# Patient Record
Sex: Female | Born: 1970 | Race: Black or African American | Hispanic: No | Marital: Single | Smoking: Never smoker
Health system: Southern US, Community
[De-identification: ages and names within clinical notes are randomized; demographics above are authoritative.]

## PROBLEM LIST (undated history)

## (undated) DIAGNOSIS — Z21 Asymptomatic human immunodeficiency virus [HIV] infection status: Secondary | ICD-10-CM

## (undated) DIAGNOSIS — N390 Urinary tract infection, site not specified: Secondary | ICD-10-CM

## (undated) DIAGNOSIS — B20 Human immunodeficiency virus [HIV] disease: Secondary | ICD-10-CM

## (undated) DIAGNOSIS — A64 Unspecified sexually transmitted disease: Secondary | ICD-10-CM

## (undated) DIAGNOSIS — L309 Dermatitis, unspecified: Secondary | ICD-10-CM

## (undated) DIAGNOSIS — J45909 Unspecified asthma, uncomplicated: Secondary | ICD-10-CM

## (undated) DIAGNOSIS — IMO0002 Reserved for concepts with insufficient information to code with codable children: Secondary | ICD-10-CM

## (undated) HISTORY — DX: Unspecified asthma, uncomplicated: J45.909

## (undated) HISTORY — PX: THERAPEUTIC ABORTION: SHX798

---

## 1996-08-10 DIAGNOSIS — IMO0002 Reserved for concepts with insufficient information to code with codable children: Secondary | ICD-10-CM

## 1996-08-10 DIAGNOSIS — R87619 Unspecified abnormal cytological findings in specimens from cervix uteri: Secondary | ICD-10-CM

## 1996-08-10 HISTORY — DX: Reserved for concepts with insufficient information to code with codable children: IMO0002

## 1996-08-10 HISTORY — DX: Unspecified abnormal cytological findings in specimens from cervix uteri: R87.619

## 1997-11-16 ENCOUNTER — Other Ambulatory Visit: Admission: RE | Admit: 1997-11-16 | Discharge: 1997-11-16 | Payer: Self-pay | Admitting: Obstetrics & Gynecology

## 1998-03-12 ENCOUNTER — Other Ambulatory Visit: Admission: RE | Admit: 1998-03-12 | Discharge: 1998-03-12 | Payer: Self-pay | Admitting: Obstetrics and Gynecology

## 1998-04-03 ENCOUNTER — Encounter: Admission: RE | Admit: 1998-04-03 | Discharge: 1998-04-03 | Payer: Self-pay | Admitting: Internal Medicine

## 1998-06-13 ENCOUNTER — Ambulatory Visit (HOSPITAL_COMMUNITY): Admission: RE | Admit: 1998-06-13 | Discharge: 1998-06-13 | Payer: Self-pay | Admitting: Infectious Diseases

## 1998-06-13 ENCOUNTER — Encounter: Admission: RE | Admit: 1998-06-13 | Discharge: 1998-06-13 | Payer: Self-pay | Admitting: Infectious Diseases

## 1998-07-19 ENCOUNTER — Encounter: Admission: RE | Admit: 1998-07-19 | Discharge: 1998-07-19 | Payer: Self-pay | Admitting: Internal Medicine

## 2001-08-30 ENCOUNTER — Inpatient Hospital Stay (HOSPITAL_COMMUNITY): Admission: AD | Admit: 2001-08-30 | Discharge: 2001-08-30 | Payer: Self-pay | Admitting: *Deleted

## 2002-08-15 ENCOUNTER — Inpatient Hospital Stay (HOSPITAL_COMMUNITY): Admission: AD | Admit: 2002-08-15 | Discharge: 2002-08-15 | Payer: Self-pay | Admitting: *Deleted

## 2007-12-26 ENCOUNTER — Emergency Department (HOSPITAL_COMMUNITY): Admission: EM | Admit: 2007-12-26 | Discharge: 2007-12-26 | Payer: Self-pay | Admitting: Emergency Medicine

## 2007-12-29 ENCOUNTER — Emergency Department (HOSPITAL_COMMUNITY): Admission: EM | Admit: 2007-12-29 | Discharge: 2007-12-29 | Payer: Self-pay | Admitting: Emergency Medicine

## 2009-08-31 ENCOUNTER — Emergency Department (HOSPITAL_COMMUNITY): Admission: EM | Admit: 2009-08-31 | Discharge: 2009-08-31 | Payer: Self-pay | Admitting: Emergency Medicine

## 2009-09-23 ENCOUNTER — Emergency Department (HOSPITAL_COMMUNITY): Admission: EM | Admit: 2009-09-23 | Discharge: 2009-09-23 | Payer: Self-pay | Admitting: Emergency Medicine

## 2010-07-22 ENCOUNTER — Inpatient Hospital Stay (HOSPITAL_COMMUNITY)
Admission: AD | Admit: 2010-07-22 | Discharge: 2010-07-22 | Payer: Self-pay | Source: Home / Self Care | Attending: Obstetrics & Gynecology | Admitting: Obstetrics & Gynecology

## 2010-10-20 LAB — URINALYSIS, ROUTINE W REFLEX MICROSCOPIC
Bilirubin Urine: NEGATIVE
Hgb urine dipstick: NEGATIVE
Nitrite: NEGATIVE
Protein, ur: NEGATIVE mg/dL
Urobilinogen, UA: 0.2 mg/dL (ref 0.0–1.0)

## 2010-10-20 LAB — WET PREP, GENITAL: Trich, Wet Prep: NONE SEEN

## 2010-10-20 LAB — GC/CHLAMYDIA PROBE AMP, GENITAL
Chlamydia, DNA Probe: NEGATIVE
GC Probe Amp, Genital: NEGATIVE

## 2011-02-10 ENCOUNTER — Emergency Department (HOSPITAL_COMMUNITY)
Admission: EM | Admit: 2011-02-10 | Discharge: 2011-02-10 | Disposition: A | Discharge status: Home / Self Care | Payer: Self-pay | Attending: Emergency Medicine | Admitting: Emergency Medicine

## 2011-02-10 DIAGNOSIS — H9209 Otalgia, unspecified ear: Secondary | ICD-10-CM | POA: Insufficient documentation

## 2011-02-10 DIAGNOSIS — K029 Dental caries, unspecified: Secondary | ICD-10-CM | POA: Insufficient documentation

## 2011-02-10 DIAGNOSIS — J029 Acute pharyngitis, unspecified: Secondary | ICD-10-CM | POA: Insufficient documentation

## 2011-02-10 DIAGNOSIS — R059 Cough, unspecified: Secondary | ICD-10-CM | POA: Insufficient documentation

## 2011-02-10 DIAGNOSIS — J069 Acute upper respiratory infection, unspecified: Secondary | ICD-10-CM | POA: Insufficient documentation

## 2011-02-10 DIAGNOSIS — R51 Headache: Secondary | ICD-10-CM | POA: Insufficient documentation

## 2011-02-10 DIAGNOSIS — R05 Cough: Secondary | ICD-10-CM | POA: Insufficient documentation

## 2011-07-15 ENCOUNTER — Emergency Department (HOSPITAL_COMMUNITY): Payer: Self-pay

## 2011-07-15 ENCOUNTER — Encounter: Payer: Self-pay | Admitting: Emergency Medicine

## 2011-07-15 ENCOUNTER — Emergency Department (HOSPITAL_COMMUNITY)
Admission: EM | Admit: 2011-07-15 | Discharge: 2011-07-15 | Disposition: A | Discharge status: Home / Self Care | Payer: Self-pay | Attending: Emergency Medicine | Admitting: Emergency Medicine

## 2011-07-15 DIAGNOSIS — R062 Wheezing: Secondary | ICD-10-CM | POA: Insufficient documentation

## 2011-07-15 DIAGNOSIS — J40 Bronchitis, not specified as acute or chronic: Secondary | ICD-10-CM | POA: Insufficient documentation

## 2011-07-15 DIAGNOSIS — R000 Tachycardia, unspecified: Secondary | ICD-10-CM | POA: Insufficient documentation

## 2011-07-15 DIAGNOSIS — R059 Cough, unspecified: Secondary | ICD-10-CM | POA: Insufficient documentation

## 2011-07-15 DIAGNOSIS — R509 Fever, unspecified: Secondary | ICD-10-CM | POA: Insufficient documentation

## 2011-07-15 DIAGNOSIS — R05 Cough: Secondary | ICD-10-CM | POA: Insufficient documentation

## 2011-07-15 DIAGNOSIS — R0602 Shortness of breath: Secondary | ICD-10-CM | POA: Insufficient documentation

## 2011-07-15 DIAGNOSIS — R112 Nausea with vomiting, unspecified: Secondary | ICD-10-CM | POA: Insufficient documentation

## 2011-07-15 MED ORDER — ALBUTEROL SULFATE HFA 108 (90 BASE) MCG/ACT IN AERS
2.0000 | INHALATION_SPRAY | RESPIRATORY_TRACT | Status: DC | PRN
  Administered 2011-07-15: 2 via RESPIRATORY_TRACT
  Filled 2011-07-15: qty 6.7

## 2011-07-15 MED ORDER — ACETAMINOPHEN 325 MG PO TABS
650.0000 mg | ORAL_TABLET | Freq: Once | ORAL | Status: AC
  Administered 2011-07-15: 650 mg via ORAL
  Filled 2011-07-15: qty 2

## 2011-07-15 NOTE — ED Notes (Signed)
Pt alert, nad, c/o cough, sob, onset several days ago, moist non productive cough noted, resp even unlabored, pt believes she may be pregnant

## 2011-07-15 NOTE — ED Provider Notes (Signed)
History     CSN: [id number] Arrival date & time: 07/15/2011  7:44 PM   First MD Initiated Contact with Patient 07/15/11 2109      Chief Complaint  Patient presents with  . Cough  . Shortness of Breath    (Consider location/radiation/quality/duration/timing/severity/associated sxs/prior treatment) HPI Comments: Kristin Mason reports 3 day history of coughing, wheezing, shortness of breath with exertion, low-grade fever to 102.  Denies myalgias, headache, rhinitis, abdominal pain.  She does, state she's had several episodes of nausea and vomiting without diarrhea, requesting a pregnancy test.  She has taken Tylenol or Advil for her fever.  No over-the-counter cough preparations, she has not gone to work for the past 2 days and is requesting a work.  Patient is a 40 y.o. female presenting with cough and shortness of breath. The history is provided by the patient.  Cough This is a new problem. The current episode started more than 2 days ago. The problem occurs constantly. The problem has not changed since onset.The cough is non-productive. The maximum temperature recorded prior to her arrival was 101 to 101.9 F. The fever has been present for 1 to 2 days. Associated symptoms include myalgias, shortness of breath and wheezing. Pertinent negatives include no chest pain, no chills, no sweats, no weight loss, no headaches, no rhinorrhea, no sore throat and no eye redness. She has tried nothing for the symptoms. She is not a smoker.  Shortness of Breath  Associated symptoms include a fever, cough, shortness of breath and wheezing. Pertinent negatives include no chest pain, no rhinorrhea and no sore throat.    History reviewed. No pertinent past medical history.  Past Surgical History  Procedure Date  . Cesarean section     No family history on file.  History  Substance Use Topics  . Smoking status: Not on file  . Smokeless tobacco: Not on file  . Alcohol Use:     OB History    Grav  Para Term Preterm Abortions TAB SAB Ect Mult Living                  Review of Systems  Constitutional: Positive for fever. Negative for chills, weight loss and activity change.  HENT: Negative for sore throat and rhinorrhea.   Eyes: Negative.  Negative for redness.  Respiratory: Positive for cough, shortness of breath and wheezing.   Cardiovascular: Negative for chest pain.  Gastrointestinal: Positive for nausea and vomiting. Negative for abdominal distention.  Genitourinary: Negative for dysuria, frequency, vaginal bleeding and vaginal discharge.  Musculoskeletal: Positive for myalgias. Negative for back pain.  Neurological: Negative for dizziness, weakness and headaches.  Hematological: Negative.     Allergies  Penicillins  Home Medications   Current Outpatient Rx  Name Route Sig Dispense Refill  . CLINDAMYCIN HCL 150 MG PO CAPS Oral Take 150 mg by mouth 3 (three) times daily. 10 day course of therapy; completed.     . GUAIFENESIN-DM 100-10 MG/5ML PO SYRP Oral Take 10 mLs by mouth every 4 (four) hours as needed. For cough.     . IBUPROFEN 200 MG PO TABS Oral Take 600 mg by mouth every 6 (six) hours as needed. For pain.     . OXYCODONE-ACETAMINOPHEN 5-325 MG PO TABS Oral Take 1 tablet by mouth every 4 (four) hours as needed. For pain.       BP 146/93  Pulse 107  Temp(Src) 99.5 F (37.5 C) (Oral)  Resp 22  SpO2 90%  LMP 06/29/2011  Physical Exam  Constitutional: She appears well-developed.  Eyes: Pupils are equal, round, and reactive to light.  Neck: Normal range of motion.  Cardiovascular: Tachycardia present.   Pulmonary/Chest: Breath sounds normal. No respiratory distress. She has no wheezes. She exhibits no tenderness.  Abdominal: Soft.  Musculoskeletal: She exhibits no tenderness.  Skin: Skin is warm and dry. No rash noted.  Psychiatric: She has a normal mood and affect.    ED Course  Procedures (including critical care time)   Labs Reviewed  POCT  PREGNANCY, URINE  POCT PREGNANCY, URINE   Dg Chest 2 View  07/15/2011  *RADIOLOGY REPORT*  Clinical Data: Cough, congestion, shortness of breath and fever.  CHEST - 2 VIEW  Comparison: 12/29/2007.  Findings: The heart size and mediastinal contours are stable. There is mild central airway thickening without hyperinflation or confluent airspace opacity.  There is no pleural effusion.  Osseous structures appear normal.  IMPRESSION: Central airway thickening suggesting bronchitis or viral infection. No evidence of pneumonia.  Original Report Authenticated By: Gerrianne Scale, M.D.     1. Bronchitis     After albuterol treatment.  Patient able to take deep breaths without coughing, feels much better.  Will discharge home with the albuterol inhaler  MDM  After review of xray, HPI and PE this is most likely bronchitis will give Albuterol inhaler in department and reevaluate post use         Kristin Filter, NP 07/15/11 2126  Kristin Filter, NP 07/15/11 2236  Kristin Filter, NP 07/15/11 2239

## 2011-07-16 NOTE — ED Provider Notes (Signed)
Medical screening examination/treatment/procedure(s) were performed by non-physician practitioner and as supervising physician I was immediately available for consultation/collaboration.  Raeford Razor, MD 07/16/11 [phone]

## 2011-10-14 ENCOUNTER — Encounter (HOSPITAL_COMMUNITY): Payer: Self-pay | Admitting: Emergency Medicine

## 2011-10-14 ENCOUNTER — Emergency Department (HOSPITAL_COMMUNITY)
Admission: EM | Admit: 2011-10-14 | Discharge: 2011-10-14 | Disposition: A | Discharge status: Home / Self Care | Payer: Self-pay | Attending: Emergency Medicine | Admitting: Emergency Medicine

## 2011-10-14 DIAGNOSIS — M545 Low back pain, unspecified: Secondary | ICD-10-CM | POA: Insufficient documentation

## 2011-10-14 DIAGNOSIS — R42 Dizziness and giddiness: Secondary | ICD-10-CM | POA: Insufficient documentation

## 2011-10-14 DIAGNOSIS — R109 Unspecified abdominal pain: Secondary | ICD-10-CM | POA: Insufficient documentation

## 2011-10-14 DIAGNOSIS — R112 Nausea with vomiting, unspecified: Secondary | ICD-10-CM | POA: Insufficient documentation

## 2011-10-14 DIAGNOSIS — N898 Other specified noninflammatory disorders of vagina: Secondary | ICD-10-CM | POA: Insufficient documentation

## 2011-10-14 DIAGNOSIS — N72 Inflammatory disease of cervix uteri: Secondary | ICD-10-CM | POA: Insufficient documentation

## 2011-10-14 DIAGNOSIS — N949 Unspecified condition associated with female genital organs and menstrual cycle: Secondary | ICD-10-CM | POA: Insufficient documentation

## 2011-10-14 LAB — URINALYSIS, ROUTINE W REFLEX MICROSCOPIC
Ketones, ur: NEGATIVE mg/dL
Leukocytes, UA: NEGATIVE
Nitrite: NEGATIVE
Specific Gravity, Urine: 1.016 (ref 1.005–1.030)
pH: 6 (ref 5.0–8.0)

## 2011-10-14 LAB — WET PREP, GENITAL

## 2011-10-14 MED ORDER — TRAMADOL HCL 50 MG PO TABS: 50.0000 mg | ORAL_TABLET | Freq: Four times a day (QID) | ORAL | Status: AC | PRN

## 2011-10-14 MED ORDER — AZITHROMYCIN 250 MG PO TABS
2000.0000 mg | ORAL_TABLET | Freq: Once | ORAL | Status: AC
  Administered 2011-10-14: 2000 mg via ORAL
  Filled 2011-10-14: qty 8

## 2011-10-14 MED ORDER — ONDANSETRON 4 MG PO TBDP
4.0000 mg | ORAL_TABLET | Freq: Once | ORAL | Status: AC
  Administered 2011-10-14: 4 mg via ORAL
  Filled 2011-10-14: qty 1

## 2011-10-14 NOTE — ED Notes (Signed)
Pt c/o lower abd pain, radiating to right side and right side of lower back x 2 weeks. Pt c/o nausea, vomiting, and polyuria.  Pt denies dyuria, c/o white vaginal d/c.

## 2011-10-14 NOTE — ED Provider Notes (Signed)
History     CSN: [id number]  Arrival date & time 10/14/11  1019   First MD Initiated Contact with Patient 10/14/11 1039      Chief Complaint  Patient presents with  . Back Pain  . Pelvic Pain    (Consider location/radiation/quality/duration/timing/severity/associated sxs/prior treatment) The history is provided by the patient.  The pt is a 41 y/o F with no sig PMH who presents to the ED with a c/c of lower abd pain x 2 weeks. Pain is intermittent, gradually worsening over time, cramping and sharp in nature, moderate in intensity. Pain intermittent radiates to either the right or left lower back. There has been associated nausea with a few episodes of vomiting with the appearance of undigested food, no vomit today. There is also assoc intermittent dizziness upon standing described as "woozines" that resolves after a few seconds and has not occurred today. The patient does report white vaginal discharge without vaginal pain or pain on intercourse. Intermittent condom use. There is no associated fever, chills, change in bowel habits, dysuria, hematuria. LMP was Feb 8 and was normal. Temporary improvement in symptoms with ibuprofen, nothing makes the symptoms worse. No prior medical evaluation for this problem.  History reviewed. No pertinent past medical history.  Past Surgical History  Procedure Date  . Cesarean section     History reviewed. No pertinent family history.  History  Substance Use Topics  . Smoking status: Non-smoker  . Smokeless tobacco: No  . Alcohol Use: Socially     Review of Systems 10 systems reviewed and are negative for acute change except as noted in the HPI.  Allergies  Penicillins  Home Medications   Current Outpatient Rx  Name Route Sig Dispense Refill         . GUAIFENESIN-DM 100-10 MG/5ML PO SYRP Oral Take 10 mLs by mouth every 4 (four) hours as needed. For cough.     . IBUPROFEN 200 MG PO TABS Oral Take 600 mg by mouth every 6 (six) hours as  needed. For pain.     . OXYCODONE-ACETAMINOPHEN 5-325 MG PO TABS Oral Take 1 tablet by mouth every 4 (four) hours as needed. For pain.       BP 126/82  Pulse 81  Temp(Src) 98.5 F (36.9 C) (Oral)  Resp 18  Ht 5\' 3"  (1.6 m)  Wt 165 lb (74.844 kg)  BMI 29.23 kg/m2  SpO2 98%  LMP 09/17/2010  Physical Exam  Nursing note and vitals reviewed. Constitutional: She is oriented to person, place, and time. She appears well-developed and well-nourished. No distress.  HENT:  Head: Normocephalic and atraumatic.  Right Ear: External ear normal.  Left Ear: External ear normal.  Mouth/Throat: Oropharynx is clear and moist.  Eyes: Pupils are equal, round, and reactive to light.  Neck: Normal range of motion. Neck supple.  Cardiovascular: Normal rate, regular rhythm, normal heart sounds and intact distal pulses.   Pulmonary/Chest: Effort normal. No respiratory distress. She has no wheezes. She exhibits no tenderness.  Abdominal: Soft. Bowel sounds are normal. She exhibits no distension. There is no rebound and no guarding.       Mild suprapubic tenderness. No rigidity  Genitourinary: There is no rash, tenderness or lesion on the right labia. There is no rash, tenderness or lesion on the left labia. Uterus is not enlarged and not tender. Cervix exhibits discharge (yellow-white discharge). Cervix exhibits no motion tenderness and no friability. Right adnexum displays no mass and no tenderness. Left adnexum displays no  mass and no tenderness. No tenderness or bleeding around the vagina. No foreign body around the vagina. Vaginal discharge found.  Musculoskeletal: She exhibits no edema and no tenderness.  Neurological: She is alert and oriented to person, place, and time.  Skin: Skin is warm and dry.  Psychiatric: She has a normal mood and affect.    ED Course  Procedures (including critical care time)  Labs Reviewed  WET PREP, GENITAL - Abnormal; Notable for the following:    WBC, Wet Prep HPF POC  MANY (*)    All other components within normal limits  PREGNANCY, URINE  URINALYSIS, ROUTINE W REFLEX MICROSCOPIC  GC/CHLAMYDIA PROBE AMP, GENITAL   No results found.   Dx 1: Cervicitis   MDM  10:48 AM Pt has been seen and evaluated. U/a ordered. In no distress, no pain medication needed at this time. Pelvic exam to be performed.      11:23 AM Pelvic exam completed. No indication for pelvis US based on exam. No distress. Awaiting results of UA and wet prep.   11:49 AM U/a and wet prep results reviewed, sig for many WBC on wet prep. Will tx for cervicitis. As pt reports anaphylactic rxn to pcn as a child, will tx with 2g azithromycin PO x 1 in ED. Will give zofran in ED to help prevent stomach upset from large azithro dose. Pt advised of reasoning for treatment and need to follow-up with test results as well as have further STD testing, voices understanding. Doubt PID as no fever, no CMT.  Shaaron Adler, PA-C 10/14/11 [address] [place], [place] 10/14/11 1152

## 2011-10-14 NOTE — Discharge Instructions (Signed)
You were treated today for gonorrhea and chlamydia based on your test results and physical exam findings. The final results of your test should be available in 3 days.   Free STD Testing These locations offer FREE, confidential testing for HIV, chlamydia, gonorrhea, and syphilis: [facility]: [address] [place]    [phone]    Mondays from 5pm-7pm [facility]: [address] [place], [address]    Self Help Building    [phone]    Wednesdays from 2pm-8pm [facility]: [address]    [phone]    Thursdays from 9am-12pm and 1pm-4pm [facility]: [address]    [phone]    Tuesdays from 9am-12pm, Thursdays from 1pm-4pm   [facility] offers FREE, confidential testing and treatment for HIV, chlamydia, gonorrhea, syphilis, herpes, bacterial vaginosis, yeast, and trichomoniasis:  Call [phone] for an appointment at either location, testing is Monday through Friday at both locations  [facility]: [address] [facility]: [address]          Cervicitis Cervicitis is a soreness and swelling (inflammation) of the cervix. Your cervix is located at the bottom of your uterus which opens up to the vagina.  CAUSES   Sexually transmitted infections (STIs).   Allergic reaction.   Medicines or birth control devices that are put in the vagina.   Injury to the cervix.   Bacterial infections.  SYMPTOMS  There may be no symptoms. If symptoms occur, they may include:  Grey, white, yellow, or bad smelling vaginal discharge.   Pain or itching of the area outside the vagina.   Painful sexual intercourse.   Lower abdominal or lower back pain, especially during intercourse.   Frequent urination.   Abnormal vaginal bleeding between periods, after sexual intercourse, or after menopause.   Pressure or a heavy feeling in the pelvis.  DIAGNOSIS    Diagnosis is made after a pelvic exam. Other tests may include:  Examination of any discharge under a microscope (wet prep).   A Pap test.  TREATMENT  Treatment will depend on the cause of cervicitis. If it is caused by an STI, both you and your partner will need to be treated. Antibiotic medicines will be given. HOME CARE INSTRUCTIONS   Do not have sexual intercourse until your caregiver says it is okay.   Do not have sexual intercourse until your partner has been treated if your cervicitis is caused by an STI.   Take your antibiotics as directed. Finish them even if you start to feel better.  SEEK IMMEDIATE MEDICAL CARE IF:   Your symptoms come back.   You have a fever.   You experience any problems that may be related to the medicine you are taking.  MAKE SURE YOU:   Understand these instructions.   Will watch your condition.   Will get help right away if you are not doing well or get worse.  Document Released: 07/27/2005 Document Revised: 07/16/2011 Document Reviewed: 02/23/2011 [facility] Patient Information 2012 [place], [place].

## 2011-10-15 ENCOUNTER — Emergency Department (HOSPITAL_COMMUNITY)
Admission: EM | Admit: 2011-10-15 | Discharge: 2011-10-15 | Disposition: A | Discharge status: Home / Self Care | Payer: Self-pay | Attending: Emergency Medicine | Admitting: Emergency Medicine

## 2011-10-15 ENCOUNTER — Encounter (HOSPITAL_COMMUNITY): Payer: Self-pay | Admitting: Emergency Medicine

## 2011-10-15 DIAGNOSIS — R197 Diarrhea, unspecified: Secondary | ICD-10-CM | POA: Insufficient documentation

## 2011-10-15 DIAGNOSIS — R109 Unspecified abdominal pain: Secondary | ICD-10-CM | POA: Insufficient documentation

## 2011-10-15 LAB — GC/CHLAMYDIA PROBE AMP, GENITAL
Chlamydia, DNA Probe: NEGATIVE
GC Probe Amp, Genital: NEGATIVE

## 2011-10-15 MED ORDER — ACIDOPHILUS PROBIOTIC 100 MG PO CAPS: 100.0000 mg | ORAL_CAPSULE | Freq: Two times a day (BID) | ORAL | Status: DC

## 2011-10-15 MED ORDER — HYDROCODONE-ACETAMINOPHEN 5-325 MG PO TABS: 2.0000 | ORAL_TABLET | ORAL | Status: AC | PRN

## 2011-10-15 NOTE — ED Provider Notes (Signed)
History     CSN: [id number]  Arrival date & time 10/15/11  0746   First MD Initiated Contact with Patient 10/15/11 [phone]      Chief Complaint  Patient presents with  . Diarrhea     HPI Pt states she was treated for STD yesterday. Was given loading dose of abx yesterday. Left yesterday at 1200, and began having diarrhea at 1400. "on and off all night." Last episode of diarrhea at 0645 today. Able to drink fluids, but states it just comes right out  History reviewed. No pertinent past medical history.  Past Surgical History  Procedure Date  . Cesarean section     History reviewed. No pertinent family history.  History  Substance Use Topics  . Smoking status: Never Smoker   . Smokeless tobacco: Not on file  . Alcohol Use: Yes    OB History    Grav Para Term Preterm Abortions TAB SAB Ect Mult Living                  Review of Systems  All other systems reviewed and are negative.    Allergies  Penicillins  Home Medications   Current Outpatient Rx  Name Route Sig Dispense Refill  . IBUPROFEN 200 MG PO TABS Oral Take 600 mg by mouth every 6 (six) hours as needed. For pain.    Marland Kitchen HYDROCODONE-ACETAMINOPHEN 5-325 MG PO TABS Oral Take 2 tablets by mouth every 4 (four) hours as needed for pain. 10 tablet 0  . ACIDOPHILUS PROBIOTIC 100 MG PO CAPS Oral Take 1 capsule (100 mg total) by mouth 2 (two) times daily. 20 capsule 0  . TRAMADOL HCL 50 MG PO TABS Oral Take 1 tablet (50 mg total) by mouth every 6 (six) hours as needed for pain. 15 tablet 0    BP 117/66  Pulse 76  Temp(Src) 97.9 F (36.6 C) (Oral)  Resp 16  Wt 165 lb (74.844 kg)  SpO2 100%  LMP 09/17/2010  Physical Exam  Nursing note and vitals reviewed. Constitutional: She is oriented to person, place, and time. She appears well-developed and well-nourished. No distress.  HENT:  Head: Normocephalic and atraumatic.  Eyes: Pupils are equal, round, and reactive to light.  Neck: Normal range of motion.    Cardiovascular: Normal rate and intact distal pulses.   Pulmonary/Chest: No respiratory distress.  Abdominal: Normal appearance. She exhibits no distension. There is no tenderness. There is no rebound and no guarding.  Musculoskeletal: Normal range of motion.  Neurological: She is alert and oriented to person, place, and time. No cranial nerve deficit.  Skin: Skin is warm and dry. No rash noted.  Psychiatric: She has a normal mood and affect. Her behavior is normal.    ED Course  Procedures (including critical care time)  Labs Reviewed - No data to display No results found.   1. Diarrhea   2. Abdominal  pain, other specified site       MDM          Nelia Shi, MD 10/15/11 [phone]

## 2011-10-15 NOTE — ED Notes (Signed)
Pt states she was treated for STD yesterday.  Was given loading dose of abx yesterday.  Left yesterday at 1200, and began having diarrhea at 1400.  "on and off all night."  Last episode of diarrhea at 0645 today.  Able to drink fluids, but states it just comes right out.

## 2011-10-15 NOTE — ED Provider Notes (Signed)
Medical screening examination/treatment/procedure(s) were conducted as a shared visit with non-physician practitioner(s) and myself.  I personally evaluated the patient during the encounter Pt c/o lower abd crampy pain, bil. No fever/chills. abd soft nt.   Suzi Roots, MD 10/15/11 [phone]

## 2011-10-15 NOTE — Discharge Instructions (Signed)
Antibiotic-Associated Diarrhea You or your child's diarrhea is due to taking an antibiotic medicine. This is a common reaction to these drugs. It does not mean you are allergic to the medicine. The diarrhea is usually not severe. The stools will return to normal several days after completing the antibiotic treatment. If a diaper rash occurs, you can wash the irritated area carefully. Then apply a cream or an ointment to protect the skin. Normally it is best to complete the antibiotic treatment. To reduce symptoms avoid:  Apple and grape fruit juices.   Dairy products.   Beans.  Encourage plenty of clear fluids, such as water or sports drinks. Cultured dairy products such as yogurt may help restore normal intestinal bacteria. Medicines to control the diarrhea may help reduce symptoms. But these should not be used if the stool is bloody.  SEEK IMMEDIATE MEDICAL CARE IF:   Diarrhea does not improve after completing the antibiotic medicine.   You notice a fever, bloody stools, increased pain, or vomiting.  Document Released: 09/03/2004 Document Revised: 07/16/2011 Document Reviewed: 03/01/2008 [employer] Patient Information 2012 [employer]. 

## 2012-02-22 ENCOUNTER — Encounter (HOSPITAL_COMMUNITY): Payer: Self-pay | Admitting: *Deleted

## 2012-02-22 ENCOUNTER — Emergency Department (HOSPITAL_COMMUNITY)
Admission: EM | Admit: 2012-02-22 | Discharge: 2012-02-22 | Disposition: A | Discharge status: Home / Self Care | Payer: Self-pay | Attending: Emergency Medicine | Admitting: Emergency Medicine

## 2012-02-22 DIAGNOSIS — N390 Urinary tract infection, site not specified: Secondary | ICD-10-CM | POA: Insufficient documentation

## 2012-02-22 DIAGNOSIS — A499 Bacterial infection, unspecified: Secondary | ICD-10-CM | POA: Insufficient documentation

## 2012-02-22 DIAGNOSIS — A599 Trichomoniasis, unspecified: Secondary | ICD-10-CM | POA: Insufficient documentation

## 2012-02-22 DIAGNOSIS — N760 Acute vaginitis: Secondary | ICD-10-CM | POA: Insufficient documentation

## 2012-02-22 DIAGNOSIS — B9689 Other specified bacterial agents as the cause of diseases classified elsewhere: Secondary | ICD-10-CM | POA: Insufficient documentation

## 2012-02-22 DIAGNOSIS — R21 Rash and other nonspecific skin eruption: Secondary | ICD-10-CM | POA: Insufficient documentation

## 2012-02-22 HISTORY — DX: Urinary tract infection, site not specified: N39.0

## 2012-02-22 LAB — URINALYSIS, ROUTINE W REFLEX MICROSCOPIC
Bilirubin Urine: NEGATIVE
Glucose, UA: NEGATIVE mg/dL
Ketones, ur: NEGATIVE mg/dL
Protein, ur: NEGATIVE mg/dL
Urobilinogen, UA: 0.2 mg/dL (ref 0.0–1.0)

## 2012-02-22 LAB — URINE MICROSCOPIC-ADD ON

## 2012-02-22 LAB — WET PREP, GENITAL

## 2012-02-22 MED ORDER — TINIDAZOLE 500 MG PO TABS: ORAL_TABLET | ORAL | Status: DC

## 2012-02-22 MED ORDER — METRONIDAZOLE 500 MG PO TABS: 500.0000 mg | ORAL_TABLET | Freq: Two times a day (BID) | ORAL | Status: DC

## 2012-02-22 MED ORDER — CIPROFLOXACIN HCL 500 MG PO TABS: 500.0000 mg | ORAL_TABLET | Freq: Two times a day (BID) | ORAL | Status: AC

## 2012-02-22 MED ORDER — PHENAZOPYRIDINE HCL 200 MG PO TABS
200.0000 mg | ORAL_TABLET | Freq: Three times a day (TID) | ORAL | Status: DC
  Administered 2012-02-22: 200 mg via ORAL
  Filled 2012-02-22: qty 1

## 2012-02-22 NOTE — ED Provider Notes (Addendum)
History     CSN: [id number]  Arrival date & time 02/22/12  0745   First MD Initiated Contact with Patient 02/22/12 [phone]      Chief Complaint  Patient presents with  . Abdominal Pain    (Consider location/radiation/quality/duration/timing/severity/associated sxs/prior treatment) HPI Comments: Patient presents with a 3 to four-day history of some crampy pain in her lower abdomen associated with burning on urination and urinary frequency. She's also noted a little bit of vaginal discharge. She denies any back pain. Denies he nausea or vomiting. Denies a fevers or chills. Denies any vaginal bleeding. She's had a history of UTIs in the Past and says this feels similar although she's also had vaginal infections in the past as well. She also has a little rash on her left upper chest which is itchy and has been going on for about 3-4 weeks. She's been taking over-the-counter Advil and Tylenol for abdominal pain with no improvement of symptoms  Patient is a 41 y.o. female presenting with abdominal pain. The history is provided by the patient.  Abdominal Pain The primary symptoms of the illness include abdominal pain and nausea. The primary symptoms of the illness do not include fever, fatigue, shortness of breath, vomiting, diarrhea, vaginal discharge or vaginal bleeding.  Additional symptoms associated with the illness include urgency and frequency. Symptoms associated with the illness do not include chills, diaphoresis, hematuria or back pain.    Past Medical History  Diagnosis Date  . UTI (urinary tract infection)     Past Surgical History  Procedure Date  . Cesarean section     No family history on file.  History  Substance Use Topics  . Smoking status: Never Smoker   . Smokeless tobacco: Not on file  . Alcohol Use: Yes     socially    OB History    Grav Para Term Preterm Abortions TAB SAB Ect Mult Living                  Review of Systems  Constitutional: Negative for  fever, chills, diaphoresis and fatigue.  HENT: Negative for congestion, rhinorrhea and sneezing.   Eyes: Negative.   Respiratory: Negative for cough, chest tightness and shortness of breath.   Cardiovascular: Negative for chest pain and leg swelling.  Gastrointestinal: Positive for nausea and abdominal pain. Negative for vomiting, diarrhea and blood in stool.  Genitourinary: Positive for urgency and frequency. Negative for hematuria, flank pain, vaginal bleeding, vaginal discharge and difficulty urinating.  Musculoskeletal: Negative for back pain and arthralgias.  Skin: Negative for rash.  Neurological: Negative for dizziness, speech difficulty, weakness, numbness and headaches.    Allergies  Penicillins  Home Medications   Current Outpatient Rx  Name Route Sig Dispense Refill  . ACETAMINOPHEN 500 MG PO TABS Oral Take 1,000 mg by mouth every 6 (six) hours as needed.    . IBUPROFEN 200 MG PO TABS Oral Take 600 mg by mouth every 6 (six) hours as needed. For pain.    Marland Kitchen CIPROFLOXACIN HCL 500 MG PO TABS Oral Take 1 tablet (500 mg total) by mouth every 12 (twelve) hours. 14 tablet 0  . METRONIDAZOLE 500 MG PO TABS Oral Take 1 tablet (500 mg total) by mouth 2 (two) times daily. 14 tablet 0    BP 129/81  Pulse 73  Temp 98 F (36.7 C) (Oral)  Resp 16  SpO2 100%  LMP 02/05/2012  Physical Exam  Constitutional: She is oriented to person, place, and time. She  appears well-developed and well-nourished.  HENT:  Head: Normocephalic and atraumatic.  Eyes: Pupils are equal, round, and reactive to light.  Neck: Normal range of motion. Neck supple.  Cardiovascular: Normal rate, regular rhythm and normal heart sounds.   Pulmonary/Chest: Effort normal and breath sounds normal. No respiratory distress. She has no wheezes. She has no rales. She exhibits no tenderness.  Abdominal: Soft. Bowel sounds are normal. There is no tenderness. There is no rebound and no guarding.  Musculoskeletal: Normal  range of motion. She exhibits no edema.  Lymphadenopathy:    She has no cervical adenopathy.  Neurological: She is alert and oriented to person, place, and time.  Skin: Skin is warm and dry. Rash noted.       3 cm area of raised scaly rash on her left upper chest there is no vesicles. No erythema. No signs of infection. No petechiae or purpura  Psychiatric: She has a normal mood and affect.    ED Course  Procedures (including critical care time)  Results for orders placed during the hospital encounter of 02/22/12  URINALYSIS, ROUTINE W REFLEX MICROSCOPIC      Component Value Range   Color, Urine YELLOW  YELLOW   APPearance CLOUDY (*) CLEAR   Specific Gravity, Urine 1.022  1.005 - 1.030   pH 6.0  5.0 - 8.0   Glucose, UA NEGATIVE  NEGATIVE mg/dL   Hgb urine dipstick TRACE (*) NEGATIVE   Bilirubin Urine NEGATIVE  NEGATIVE   Ketones, ur NEGATIVE  NEGATIVE mg/dL   Protein, ur NEGATIVE  NEGATIVE mg/dL   Urobilinogen, UA 0.2  0.0 - 1.0 mg/dL   Nitrite NEGATIVE  NEGATIVE   Leukocytes, UA LARGE (*) NEGATIVE  PREGNANCY, URINE      Component Value Range   Preg Test, Ur NEGATIVE  NEGATIVE  WET PREP, GENITAL      Component Value Range   Yeast Wet Prep HPF POC NONE SEEN  NONE SEEN   Trich, Wet Prep MODERATE (*) NONE SEEN   Clue Cells Wet Prep HPF POC FEW (*) NONE SEEN   WBC, Wet Prep HPF POC FEW (*) NONE SEEN  URINE MICROSCOPIC-ADD ON      Component Value Range   Squamous Epithelial / LPF RARE  RARE   WBC, UA TOO NUMEROUS TO COUNT  <3 WBC/hpf   RBC / HPF 0-2  <3 RBC/hpf   Bacteria, UA MANY (*) RARE   No results found.   1. Trichomonas   2. BV (bacterial vaginosis)   3. UTI (lower urinary tract infection)       MDM  Will go ahead and treat patient with Flagyl for Trichomonas and bacterial vaginosis. I will hold off on treating for GC and Chlamydia given her significant penicillin allergy until cultures are back.  Pt says that she had an allergic rx to flagyl where her feet  turned blue and numb.  I was not familiar with this rx and not convinced that it is a true allergy, but discussed with pharmacist who advised an alternative would be timidazole which I will prescribe     Rolan Bucco, MD 02/22/12 1610  Rolan Bucco, MD 02/22/12 [phone]

## 2012-02-22 NOTE — ED Notes (Addendum)
Pt c/o lower abd pain x3  days. +nausea. States she was treated at [facility] x3 mo ago for same symptoms, was treated for possible STD/ UTI. STD negative, pt given mult antibiotics (allergic to PCN) which gave diarrhea, pt back in ED and treated for diarrhea symptoms. Pt states hx of shingles in 2011, has scars on chest/back. States recent reaction on chest and upper back - itching x3 weeks. Pt unsure if symptoms related

## 2012-02-23 LAB — GC/CHLAMYDIA PROBE AMP, GENITAL: Chlamydia, DNA Probe: NEGATIVE

## 2012-03-28 ENCOUNTER — Emergency Department (HOSPITAL_COMMUNITY)
Admission: EM | Admit: 2012-03-28 | Discharge: 2012-03-28 | Disposition: A | Discharge status: Home / Self Care | Payer: Self-pay | Attending: Emergency Medicine | Admitting: Emergency Medicine

## 2012-03-28 ENCOUNTER — Encounter (HOSPITAL_COMMUNITY): Payer: Self-pay | Admitting: *Deleted

## 2012-03-28 DIAGNOSIS — Z880 Allergy status to penicillin: Secondary | ICD-10-CM | POA: Insufficient documentation

## 2012-03-28 DIAGNOSIS — K047 Periapical abscess without sinus: Secondary | ICD-10-CM | POA: Insufficient documentation

## 2012-03-28 MED ORDER — HYDROCODONE-ACETAMINOPHEN 5-325 MG PO TABS: 2.0000 | ORAL_TABLET | ORAL | Status: DC | PRN

## 2012-03-28 MED ORDER — CLINDAMYCIN HCL 150 MG PO CAPS: 150.0000 mg | ORAL_CAPSULE | Freq: Four times a day (QID) | ORAL | Status: DC

## 2012-03-28 NOTE — ED Provider Notes (Signed)
History     CSN: [id number]  Arrival date & time 03/28/12  1516   None     Chief Complaint  Patient presents with  . Dental Problem    (Consider location/radiation/quality/duration/timing/severity/associated sxs/prior treatment) HPI Comments: Patient presents with tooth pain that started 2 days ago after her filling fell out. Starting today, she woke up with worsening pain and generalized swelling of her left face. She reports that pain as aching and severe. She reports an associated frontal headache. She has tried 800mg  ibuprofen for pain which has not helped. She denies visual changes, fever, trouble breathing or difficulty swallowing.    Past Medical History  Diagnosis Date  . UTI (urinary tract infection)     Past Surgical History  Procedure Date  . Cesarean section     History reviewed. No pertinent family history.  History  Substance Use Topics  . Smoking status: Never Smoker   . Smokeless tobacco: Not on file  . Alcohol Use: Yes     socially    OB History    Grav Para Term Preterm Abortions TAB SAB Ect Mult Living                  Review of Systems  Constitutional: Negative for fever and fatigue.  HENT: Positive for facial swelling. Negative for nosebleeds, congestion and postnasal drip.   Eyes: Negative for photophobia.  Respiratory: Negative for cough and shortness of breath.   Cardiovascular: Positive for chest pain.  Gastrointestinal: Negative for abdominal pain.  Neurological: Positive for facial asymmetry and headaches. Negative for dizziness and light-headedness.    Allergies  Penicillins and Flagyl  Home Medications   Current Outpatient Rx  Name Route Sig Dispense Refill  . HYDROCODONE-ACETAMINOPHEN 5-325 MG PO TABS Oral Take 1 tablet by mouth every 6 (six) hours as needed. pain    . IBUPROFEN 200 MG PO TABS Oral Take 600 mg by mouth every 6 (six) hours as needed. For pain.    Marland Kitchen CLINDAMYCIN HCL 150 MG PO CAPS Oral Take 1 capsule (150 mg  total) by mouth every 6 (six) hours. 28 capsule 0  . HYDROCODONE-ACETAMINOPHEN 5-325 MG PO TABS Oral Take 2 tablets by mouth every 4 (four) hours as needed for pain. 6 tablet 0    BP 118/91  Pulse 109  Temp 98.7 F (37.1 C) (Oral)  Resp 18  SpO2 98%  LMP 03/06/2012  Physical Exam  Nursing note and vitals reviewed. Constitutional: She is oriented to person, place, and time. She appears well-developed and well-nourished. No distress.  HENT:  Head: Normocephalic.       Mild facial swelling of the left side that starts under lower lid and extends to lower jaw. Palpable fluctuant mass under upper lip on left side. Poor dentition with multiple broken and missing teeth.   Eyes: Conjunctivae are normal.  Neck: Normal range of motion.  Cardiovascular: Normal rate and regular rhythm.  Exam reveals no gallop and no friction rub.   No murmur heard. Pulmonary/Chest: Effort normal. No respiratory distress. She has no wheezes. She has no rales. She exhibits no tenderness.  Musculoskeletal: Normal range of motion.  Neurological: She is alert and oriented to person, place, and time. No cranial nerve deficit. Coordination normal.  Skin: Skin is warm and dry. She is not diaphoretic.  Psychiatric: She has a normal mood and affect. Her behavior is normal.    ED Course  Procedures (including critical care time)  Labs Reviewed - No data  to display No results found.   1. Dental abscess       MDM  Patient can be discharged with pain medication and clindamycin due to her penicillin allergy. Patient should follow up with a dentist. No further evaluation needed at this time.         Emilia Beck, PA-C 03/28/12 2237

## 2012-03-28 NOTE — ED Notes (Signed)
Pt states "the filling fell out of my tooth and the other tooth beside of it broke off." pt reports swelling to left side of face and pain from tooth. Pt reports she has no insurance and needs a cheaper prescription.

## 2012-03-29 ENCOUNTER — Emergency Department (HOSPITAL_COMMUNITY)
Admission: EM | Admit: 2012-03-29 | Discharge: 2012-03-29 | Disposition: A | Discharge status: Home / Self Care | Payer: Self-pay | Attending: Emergency Medicine | Admitting: Emergency Medicine

## 2012-03-29 ENCOUNTER — Encounter (HOSPITAL_COMMUNITY): Payer: Self-pay | Admitting: *Deleted

## 2012-03-29 DIAGNOSIS — K047 Periapical abscess without sinus: Secondary | ICD-10-CM | POA: Insufficient documentation

## 2012-03-29 MED ORDER — LIDOCAINE HCL 2 % IJ SOLN
INTRAMUSCULAR | Status: AC
  Filled 2012-03-29: qty 1

## 2012-03-29 MED ORDER — HYDROCODONE-ACETAMINOPHEN 5-325 MG PO TABS
2.0000 | ORAL_TABLET | Freq: Once | ORAL | Status: AC
  Administered 2012-03-29: 2 via ORAL
  Filled 2012-03-29: qty 2

## 2012-03-29 MED ORDER — HYDROCODONE-ACETAMINOPHEN 5-325 MG PO TABS: 2.0000 | ORAL_TABLET | Freq: Four times a day (QID) | ORAL | Status: AC | PRN

## 2012-03-29 NOTE — ED Provider Notes (Signed)
History     CSN: [id number]  Arrival date & time 03/29/12  1945   First MD Initiated Contact with Patient 03/29/12 2012      Chief Complaint  Patient presents with  . Dental Pain    (Consider location/radiation/quality/duration/timing/severity/associated sxs/prior treatment) HPI Comments: Patient presents today with a chief complaint of dental abscess.  She was evaluated last evening in the ED for the same.  At that time an incision and drainage was not performed.  She was started on Clindamycin, which she has been taking.  She feels that the abscess has become larger and more tender since that time.  Patient is a 41 y.o. female presenting with tooth pain. The history is provided by the patient.  Dental PainThe primary symptoms include mouth pain. Primary symptoms do not include dental injury, oral bleeding, fever or sore throat. The symptoms are worsening.  Additional symptoms include: gum swelling, gum tenderness, purulent gums and facial swelling. Additional symptoms do not include: trismus, trouble swallowing, pain with swallowing, drooling and ear pain.    Past Medical History  Diagnosis Date  . UTI (urinary tract infection)     Past Surgical History  Procedure Date  . Cesarean section     No family history on file.  History  Substance Use Topics  . Smoking status: Never Smoker   . Smokeless tobacco: Not on file  . Alcohol Use: Yes     socially    OB History    Grav Para Term Preterm Abortions TAB SAB Ect Mult Living                  Review of Systems  Constitutional: Negative for fever and chills.  HENT: Positive for facial swelling. Negative for ear pain, sore throat, drooling and trouble swallowing.   Gastrointestinal: Negative for nausea and vomiting.    Allergies  Penicillins and Flagyl  Home Medications   Current Outpatient Rx  Name Route Sig Dispense Refill  . CLINDAMYCIN HCL 150 MG PO CAPS Oral Take 150 mg by mouth every 6 (six) hours.    Marland Kitchen  HYDROCODONE-ACETAMINOPHEN 5-325 MG PO TABS Oral Take 1 tablet by mouth every 6 (six) hours as needed. pain    . IBUPROFEN 200 MG PO TABS Oral Take 600 mg by mouth every 6 (six) hours as needed. For pain.      BP 127/86  Pulse 99  Temp 98.7 F (37.1 C) (Oral)  Resp 20  Ht 5\' 3"  (1.6 m)  SpO2 97%  LMP 03/06/2012  Physical Exam  Vitals reviewed. Constitutional: She appears well-developed and well-nourished. No distress.  HENT:  Head: Normocephalic and atraumatic. No trismus in the jaw.    Mouth/Throat: Uvula is midline and oropharynx is clear and moist. Dental abscesses and dental caries present. No uvula swelling.       Dental abscess of the frontal upper gingiva.   Patient able to open and close mouth without difficulty.    Neck: Normal range of motion. Neck supple.  Cardiovascular: Normal rate, regular rhythm and normal heart sounds.   Pulmonary/Chest: Effort normal and breath sounds normal.  Lymphadenopathy:       Head (right side): No submental and no submandibular adenopathy present.       Head (left side): No submental and no submandibular adenopathy present.    She has no cervical adenopathy.  Neurological: She is alert.  Skin: Skin is warm and dry. She is not diaphoretic.  Psychiatric: She has a normal mood  and affect.    ED Course  Procedures (including critical care time)  Labs Reviewed - No data to display No results found.   No diagnosis found.  INCISION AND DRAINAGE Performed by: Anne Shutter, Braniyah Besse Consent: Verbal consent obtained. Risks and benefits: risks, benefits and alternatives were discussed Type: abscess  Body area: frontal upper gingiva  Anesthesia: local infiltration  Local anesthetic: lidocaine 2% without epinephrine  Anesthetic total: 4 ml  Complexity: complex Blunt dissection to break up loculations  Drainage: purulent  Drainage amount: moderate  Patient tolerance: Patient tolerated the procedure well with no immediate  complications.    MDM  Patient presenting with dental abscess.  Incision and drainage performed.  Patient instructed to continue taking Clindamycin and follow up with Dentist.  Exam unconcerning for Ludwig's angina or spread of infection.         Pascal Lux Hinckley, PA-C 03/30/12 2159

## 2012-03-29 NOTE — ED Notes (Signed)
Pt treated yesterday for tooth abscess; states today increased swelling/pain

## 2012-03-29 NOTE — ED Provider Notes (Signed)
Medical screening examination/treatment/procedure(s) were performed by non-physician practitioner and as supervising physician I was immediately available for consultation/collaboration.  Raeford Razor, MD 03/29/12 1520

## 2012-03-30 NOTE — ED Provider Notes (Signed)
Medical screening examination/treatment/procedure(s) were performed by non-physician practitioner and as supervising physician I was immediately available for consultation/collaboration.  Flint Melter, MD 03/30/12 [phone]

## 2012-11-18 ENCOUNTER — Encounter (HOSPITAL_COMMUNITY): Payer: Self-pay | Admitting: *Deleted

## 2012-11-18 ENCOUNTER — Inpatient Hospital Stay (HOSPITAL_COMMUNITY)
Admission: AD | Admit: 2012-11-18 | Discharge: 2012-11-18 | Disposition: A | Discharge status: Home / Self Care | Payer: Self-pay | Source: Ambulatory Visit | Attending: Obstetrics & Gynecology | Admitting: Obstetrics & Gynecology

## 2012-11-18 DIAGNOSIS — A599 Trichomoniasis, unspecified: Secondary | ICD-10-CM

## 2012-11-18 DIAGNOSIS — A5901 Trichomonal vulvovaginitis: Secondary | ICD-10-CM | POA: Insufficient documentation

## 2012-11-18 DIAGNOSIS — B9689 Other specified bacterial agents as the cause of diseases classified elsewhere: Secondary | ICD-10-CM

## 2012-11-18 DIAGNOSIS — N760 Acute vaginitis: Secondary | ICD-10-CM | POA: Insufficient documentation

## 2012-11-18 DIAGNOSIS — A499 Bacterial infection, unspecified: Secondary | ICD-10-CM

## 2012-11-18 DIAGNOSIS — N912 Amenorrhea, unspecified: Secondary | ICD-10-CM | POA: Insufficient documentation

## 2012-11-18 DIAGNOSIS — R109 Unspecified abdominal pain: Secondary | ICD-10-CM | POA: Insufficient documentation

## 2012-11-18 DIAGNOSIS — L0390 Cellulitis, unspecified: Secondary | ICD-10-CM

## 2012-11-18 DIAGNOSIS — L0291 Cutaneous abscess, unspecified: Secondary | ICD-10-CM | POA: Insufficient documentation

## 2012-11-18 HISTORY — DX: Reserved for concepts with insufficient information to code with codable children: IMO0002

## 2012-11-18 HISTORY — DX: Dermatitis, unspecified: L30.9

## 2012-11-18 LAB — POCT PREGNANCY, URINE: Preg Test, Ur: NEGATIVE

## 2012-11-18 LAB — URINALYSIS, ROUTINE W REFLEX MICROSCOPIC
Glucose, UA: NEGATIVE mg/dL
Ketones, ur: NEGATIVE mg/dL
Leukocytes, UA: NEGATIVE
Nitrite: NEGATIVE
Protein, ur: NEGATIVE mg/dL

## 2012-11-18 LAB — CBC
HCT: 36.1 % (ref 36.0–46.0)
Hemoglobin: 13 g/dL (ref 12.0–15.0)
MCH: 29.5 pg (ref 26.0–34.0)
MCV: 82 fL (ref 78.0–100.0)
Platelets: 214 10*3/uL (ref 150–400)
RBC: 4.4 MIL/uL (ref 3.87–5.11)
WBC: 6.1 10*3/uL (ref 4.0–10.5)

## 2012-11-18 LAB — WET PREP, GENITAL: Yeast Wet Prep HPF POC: NONE SEEN

## 2012-11-18 MED ORDER — SULFAMETHOXAZOLE-TRIMETHOPRIM 800-160 MG PO TABS: 1.0000 | ORAL_TABLET | Freq: Two times a day (BID) | ORAL | Status: AC

## 2012-11-18 MED ORDER — CLINDAMYCIN HCL 150 MG PO CAPS: 300.0000 mg | ORAL_CAPSULE | Freq: Two times a day (BID) | ORAL | Status: DC

## 2012-11-18 MED ORDER — IBUPROFEN 600 MG PO TABS
600.0000 mg | ORAL_TABLET | Freq: Once | ORAL | Status: AC
  Administered 2012-11-18: 600 mg via ORAL
  Filled 2012-11-18: qty 1

## 2012-11-18 MED ORDER — IBUPROFEN 200 MG PO TABS: 600.0000 mg | ORAL_TABLET | Freq: Four times a day (QID) | ORAL | Status: DC | PRN

## 2012-11-18 NOTE — MAU Note (Signed)
Boil in mid sternum, started on Mon- started with soreness, raised and red on TUes, started to leak yesterday.  Also had one on  Top of head - burst last night.. Never had these before.  Last normal cycle was in Feb,  Due to come on end of March. On March 23, started spotting- last a couple days. Never had a cycle, no bleeding since.  Had + HPT on 03/30.

## 2012-11-18 NOTE — MAU Provider Note (Signed)
History     CSN: [id number]  Arrival date and time: 11/18/12 1333   First Provider Initiated Contact with Patient 11/18/12 1434      Chief Complaint  Patient presents with  . Recurrent Skin Infections  . Abdominal Pain   HPI Ms. Kristin Mason is a 42 y.o. [phone] who presents to MAU today with complaint of skin abscess and amenorrhea. The patient states that she had a faint positive HPT this week. She is having lower abdominal cramping. She denies spotting. She is having an increase in vaginal discharge. She describes it as white, thick and with an odor. She is also having some N/V occasionally, the last time she vomited was Tuesday night. She is feeling dizzy and lightheaded and has headache occasionally as well. She denies UTI symptoms or fever. The abscess started to be sore on Monday and became more swollen and painful on Tuesday. The abscess is located on her chest in the sternum area. Last LMP was in late February. Patient does not have a history of irregular menses.    OB History   Grav Para Term Preterm Abortions TAB SAB Ect Mult Living   4 3 3  0 1 1 0 0 0 3      Past Medical History  Diagnosis Date  . UTI (urinary tract infection)   . Eczema   . Abnormal Pap smear 98    cryo therapy    Past Surgical History  Procedure Laterality Date  . Cesarean section    . Therapeutic abortion      Family History  Problem Relation Age of Onset  . Hypertension Mother   . Diabetes Mother   . Hyperlipidemia Mother   . Heart disease Mother   . Stroke Mother   . Heart disease Maternal Grandmother   . Hypertension Maternal Grandmother   . Diabetes Paternal Grandmother     History  Substance Use Topics  . Smoking status: Former Games developer  . Smokeless tobacco: Never Used  . Alcohol Use: Yes     Comment: socially    Allergies:  Allergies  Allergen Reactions  . Penicillins Anaphylaxis, Shortness Of Breath and Swelling  . Flagyl (Metronidazole) Swelling    Made  feet blue and numb    Prescriptions prior to admission  Medication Sig Dispense Refill  . acetaminophen (TYLENOL) 500 MG tablet Take 1,000 mg by mouth every 6 (six) hours as needed for pain (For headahce.).      Marland Kitchen ibuprofen (ADVIL,MOTRIN) 200 MG tablet Take 600 mg by mouth every 6 (six) hours as needed. For pain.      Marland Kitchen neomycin-bacitracin-polymyxin (NEOSPORIN) ointment Apply 1 application topically every 12 (twelve) hours as needed. apply to eye        Review of Systems  Constitutional: Negative for fever and malaise/fatigue.  Gastrointestinal: Positive for nausea, vomiting and abdominal pain. Negative for diarrhea and constipation.  Genitourinary: Negative for dysuria, urgency and frequency.       Neg - vaginal bleeding + vaginal discharge  Skin:       Boil on chest  Neurological: Positive for dizziness. Negative for loss of consciousness.   Physical Exam   Blood pressure 118/80, pulse 88, temperature 98 F (36.7 C), temperature source Oral, resp. rate 18, height 5\' 3"  (1.6 m), weight 166 lb (75.297 kg), last menstrual period 10/06/2012.  Physical Exam  Constitutional: She is oriented to person, place, and time. She appears well-developed and well-nourished. No distress.  HENT:  Head: Normocephalic and  atraumatic.  Cardiovascular: Normal rate, regular rhythm and normal heart sounds.   Respiratory: Breath sounds normal. No respiratory distress.  GI: Soft. Bowel sounds are normal. She exhibits no distension and no mass. There is tenderness (mild tenderness to palpation of the lower abdomen). There is no rebound and no guarding.  Genitourinary: Vagina normal. Uterus is not enlarged and not tender. Cervix exhibits discharge (moderate amount of thin, white discharge with odor at the cervical os and in the vagina). Cervix exhibits no motion tenderness and no friability. Right adnexum displays no mass and no tenderness. Left adnexum displays no mass and no tenderness.  Neurological: She  is alert and oriented to person, place, and time.  Skin: Skin is warm and dry. There is erythema.  4 cm abscess in the mid-sternal chest, with small amount of surrounding erythema. Warm to touch. Little fluctuance.   Psychiatric: She has a normal mood and affect.   Results for orders placed during the hospital encounter of 11/18/12 (from the past 24 hour(s))  URINALYSIS, ROUTINE W REFLEX MICROSCOPIC     Status: None   Collection Time    11/18/12  1:45 PM      Result Value Range   Color, Urine YELLOW  YELLOW   APPearance CLEAR  CLEAR   Specific Gravity, Urine 1.010  1.005 - 1.030   pH 6.5  5.0 - 8.0   Glucose, UA NEGATIVE  NEGATIVE mg/dL   Hgb urine dipstick NEGATIVE  NEGATIVE   Bilirubin Urine NEGATIVE  NEGATIVE   Ketones, ur NEGATIVE  NEGATIVE mg/dL   Protein, ur NEGATIVE  NEGATIVE mg/dL   Urobilinogen, UA 0.2  0.0 - 1.0 mg/dL   Nitrite NEGATIVE  NEGATIVE   Leukocytes, UA NEGATIVE  NEGATIVE  POCT PREGNANCY, URINE     Status: None   Collection Time    11/18/12  1:56 PM      Result Value Range   Preg Test, Ur NEGATIVE  NEGATIVE  WET PREP, GENITAL     Status: Abnormal   Collection Time    11/18/12  2:50 PM      Result Value Range   Yeast Wet Prep HPF POC NONE SEEN  NONE SEEN   Trich, Wet Prep MODERATE (*) NONE SEEN   Clue Cells Wet Prep HPF POC MODERATE (*) NONE SEEN   WBC, Wet Prep HPF POC FEW (*) NONE SEEN  CBC     Status: None   Collection Time    11/18/12  2:59 PM      Result Value Range   WBC 6.1  4.0 - 10.5 K/uL   RBC 4.40  3.87 - 5.11 MIL/uL   Hemoglobin 13.0  12.0 - 15.0 g/dL   HCT 28.4  13.2 - 44.0 %   MCV 82.0  78.0 - 100.0 fL   MCH 29.5  26.0 - 34.0 pg   MCHC 36.0  30.0 - 36.0 g/dL   RDW 10.2  72.5 - 36.6 %   Platelets 214  150 - 400 K/uL  HCG, SERUM, QUALITATIVE     Status: None   Collection Time    11/18/12  2:59 PM      Result Value Range   Preg, Serum NEGATIVE  NEGATIVE    MAU Course  Procedures None  MDM UPT and qualitative hCG - negative.  Patient should re-test in one week with HPT and follow-up as needed Patient has allergy to Flagyl. Discussed patient with Dr. Erin Fulling and she recommends Clindamycin 300 mg BID x  7 days. As this does not have as good of a cure rate as Flagyl the patient will follow-up in clinic in ~ 2 weeks for [facility] Dr. Erin Fulling came to evaluate the abscess on the patient's chest. She recommends Bactrim Rx and warm compresses until draining. Abscess is not appropriate for I&D today. Follow-up with PCP for management if symptoms do not resolve  Assessment and Plan  A: Bacterial vaginosis Trichomonas Amenorrhea Skin abscess  P: Discharge home Rx for clindamycin, bactrim and ibuprofen sent to patient's pharmacy Discussed need for TOC because of inability to use Flagyl. She will be referred to GYN clinic for follow-up in ~ 2 weeks. Further work-up for amenorrhea may be done at this time if patient still has not had a period Patient encouraged to establish primary care Patient may return to MAU as needed or if her condition were to change or worsen  Freddi Starr, PA-C  11/18/2012, 2:34 PM

## 2012-11-21 NOTE — MAU Provider Note (Signed)
Attestation of Attending Supervision of Advanced Practitioner (CNM/NP): Evaluation and management procedures were performed by the Advanced Practitioner under my supervision and collaboration.  I have reviewed the Advanced Practitioner's note and chart, and I agree with the management and plan.  HARRAWAY-SMITH, Wessley Emert 5:48 PM

## 2012-11-22 ENCOUNTER — Encounter (HOSPITAL_COMMUNITY): Payer: Self-pay | Admitting: Emergency Medicine

## 2012-11-22 ENCOUNTER — Emergency Department (HOSPITAL_COMMUNITY)
Admission: EM | Admit: 2012-11-22 | Discharge: 2012-11-22 | Disposition: A | Discharge status: Home / Self Care | Payer: Self-pay | Attending: Emergency Medicine | Admitting: Emergency Medicine

## 2012-11-22 DIAGNOSIS — Z87891 Personal history of nicotine dependence: Secondary | ICD-10-CM | POA: Insufficient documentation

## 2012-11-22 DIAGNOSIS — R059 Cough, unspecified: Secondary | ICD-10-CM | POA: Insufficient documentation

## 2012-11-22 DIAGNOSIS — L02213 Cutaneous abscess of chest wall: Secondary | ICD-10-CM

## 2012-11-22 DIAGNOSIS — Z872 Personal history of diseases of the skin and subcutaneous tissue: Secondary | ICD-10-CM | POA: Insufficient documentation

## 2012-11-22 DIAGNOSIS — Z87440 Personal history of urinary (tract) infections: Secondary | ICD-10-CM | POA: Insufficient documentation

## 2012-11-22 DIAGNOSIS — L03319 Cellulitis of trunk, unspecified: Secondary | ICD-10-CM | POA: Insufficient documentation

## 2012-11-22 DIAGNOSIS — R062 Wheezing: Secondary | ICD-10-CM | POA: Insufficient documentation

## 2012-11-22 DIAGNOSIS — J209 Acute bronchitis, unspecified: Secondary | ICD-10-CM

## 2012-11-22 DIAGNOSIS — R05 Cough: Secondary | ICD-10-CM | POA: Insufficient documentation

## 2012-11-22 DIAGNOSIS — L02219 Cutaneous abscess of trunk, unspecified: Secondary | ICD-10-CM | POA: Insufficient documentation

## 2012-11-22 MED ORDER — ALBUTEROL SULFATE HFA 108 (90 BASE) MCG/ACT IN AERS
2.0000 | INHALATION_SPRAY | RESPIRATORY_TRACT | Status: DC | PRN
  Administered 2012-11-22: 2 via RESPIRATORY_TRACT
  Filled 2012-11-22 (×2): qty 6.7

## 2012-11-22 MED ORDER — ALBUTEROL SULFATE (5 MG/ML) 0.5% IN NEBU
5.0000 mg | INHALATION_SOLUTION | Freq: Once | RESPIRATORY_TRACT | Status: AC
  Administered 2012-11-22: 5 mg via RESPIRATORY_TRACT
  Filled 2012-11-22: qty 1

## 2012-11-22 MED ORDER — TRAMADOL HCL 50 MG PO TABS: 100.0000 mg | ORAL_TABLET | Freq: Four times a day (QID) | ORAL | Status: DC | PRN

## 2012-11-22 MED ORDER — AEROCHAMBER Z-STAT PLUS/MEDIUM MISC
1.0000 | Freq: Once | Status: AC
  Administered 2012-11-22: 1
  Filled 2012-11-22: qty 1

## 2012-11-22 MED ORDER — IPRATROPIUM BROMIDE 0.02 % IN SOLN
0.5000 mg | Freq: Once | RESPIRATORY_TRACT | Status: AC
  Administered 2012-11-22: 0.5 mg via RESPIRATORY_TRACT
  Filled 2012-11-22: qty 2.5

## 2012-11-22 NOTE — ED Notes (Addendum)
Patient presents with recurrent skin infection- abscess noted to chest with purulent discharge.  No fever noted.

## 2012-11-22 NOTE — ED Provider Notes (Signed)
History     CSN: [id number]  Arrival date & time 11/22/12  1045   First MD Initiated Contact with Patient 11/22/12 1110      Chief Complaint  Patient presents with  . Abscess    (Consider location/radiation/quality/duration/timing/severity/associated sxs/prior treatment) HPI  Patient relates last week she had 3 boils or abscesses. She had one on her right lower leg, one on her right scalp, and one on her central chest. She was seen at [facility] hospital 4 days ago and started on 2 antibiotics, Cleocin and Bactrim. She was told to use Neosporin ointment on the wounds. She reports the large abscess in her right scalp has drained and is no longer painful. She also states the area on her right leg has improved without drainage. She reports it is now flat and nontender. She states yesterday she had fever to 101. She had a moderate amount of redness in drainage from the chest abscess and she relates now it is about 1/5 of its former size.  Patient also reports she's had a cough and has been having wheezing. She has had wheezing in the past but does not currently have an inhaler.  PCP none  Past Medical History  Diagnosis Date  . UTI (urinary tract infection)   . Eczema   . Abnormal Pap smear 98    cryo therapy    Past Surgical History  Procedure Laterality Date  . Cesarean section    . Therapeutic abortion      Family History  Problem Relation Age of Onset  . Hypertension Mother   . Diabetes Mother   . Hyperlipidemia Mother   . Heart disease Mother   . Stroke Mother   . Heart disease Maternal Grandmother   . Hypertension Maternal Grandmother   . Diabetes Paternal Grandmother     History  Substance Use Topics  . Smoking status: Former Games developer  . Smokeless tobacco: Never Used  . Alcohol Use: Yes     Comment: socially  employed  OB History   Grav Para Term Preterm Abortions TAB SAB Ect Mult Living   4 3 3  0 1 1 0 0 0 3      Review of Systems  All other systems  reviewed and are negative.    Allergies  Penicillins and Flagyl  Home Medications   Current Outpatient Rx  Name  Route  Sig  Dispense  Refill  . acetaminophen (TYLENOL) 500 MG tablet   Oral   Take 1,000 mg by mouth every 6 (six) hours as needed for pain (For headahce.).         Marland Kitchen clindamycin (CLEOCIN) 150 MG capsule   Oral   Take 2 capsules (300 mg total) by mouth 2 (two) times daily.   14 capsule   0   . ibuprofen (ADVIL,MOTRIN) 200 MG tablet   Oral   Take 3 tablets (600 mg total) by mouth every 6 (six) hours as needed. For pain.   30 tablet   0   . neomycin-bacitracin-polymyxin (NEOSPORIN) ointment   Topical   Apply 1 application topically every 12 (twelve) hours as needed. apply to eye         . sulfamethoxazole-trimethoprim (BACTRIM DS,SEPTRA DS) 800-160 MG per tablet   Oral   Take 1 tablet by mouth 2 (two) times daily.   14 tablet   0     BP 106/74  Pulse 98  Temp(Src) 98.1 F (36.7 C) (Oral)  Resp 18  SpO2 97%  LMP 10/30/2012  Vital signs normal    Physical Exam  Nursing note and vitals reviewed. Constitutional: She is oriented to person, place, and time. She appears well-developed and well-nourished.  Non-toxic appearance. She does not appear ill. No distress.  HENT:  Head: Normocephalic and atraumatic.  Right Ear: External ear normal.  Left Ear: External ear normal.  Nose: Nose normal. No mucosal edema or rhinorrhea.  Mouth/Throat: Oropharynx is clear and moist and mucous membranes are normal. No dental abscesses or edematous.  Eyes: Conjunctivae and EOM are normal. Pupils are equal, round, and reactive to light.  Neck: Normal range of motion and full passive range of motion without pain. Neck supple.  Cardiovascular: Normal rate, regular rhythm and normal heart sounds.  Exam reveals no gallop and no friction rub.   No murmur heard. Pulmonary/Chest: Effort normal. No respiratory distress. She has wheezes. She has no rhonchi. She has no rales.  She exhibits no tenderness and no crepitus.  Abdominal: Soft. Normal appearance and bowel sounds are normal. She exhibits no distension. There is no tenderness. There is no rebound and no guarding.  Musculoskeletal: Normal range of motion. She exhibits no edema and no tenderness.  Moves all extremities well.   Neurological: She is alert and oriented to person, place, and time. She has normal strength. No cranial nerve deficit.  Skin: Skin is warm, dry and intact. No rash noted. No erythema. No pallor.  Pt has healing scabbed area on her right scalp from recent abscess, no induration or redness. No drainage. Mild swelling.  Patient has a small scabbed area on her right proximal medial lower leg consistent with another prior healed abscess.  Patient has large red area on her proximal central chest that is approximately 5 cm in size. There is an area of white seen about one 4 cm in size however patient states that was not the area that it had been draining earlier.  Psychiatric: She has a normal mood and affect. Her speech is normal and behavior is normal. Her mood appears not anxious.    ED Course  Procedures (including critical care time)  Medications  albuterol (PROVENTIL HFA;VENTOLIN HFA) 108 (90 BASE) MCG/ACT inhaler 2 puff (not administered)  aerochamber Z-Stat Plus/medium 1 each (not administered)  albuterol (PROVENTIL) (5 MG/ML) 0.5% nebulizer solution 5 mg (5 mg Nebulization Given 11/22/12 1207)  ipratropium (ATROVENT) nebulizer solution 0.5 mg (0.5 mg Nebulization Given 11/22/12 1207)    Patient's breathing improved after the nebulizer.  INCISION AND DRAINAGE Performed by: Devoria Albe L Consent: Verbal consent obtained. Risks and benefits: risks, benefits and alternatives were discussed Type: absces  Body area: Chest wall  Anesthesia: local infiltration  Incision was made with a scalpel.  Local anesthetic: lidocaine 2%   Anesthetic total: 1 ml  Complexity: simple  Blunt  dissection to break up loculations  Drainage: none  Drainage amount: none  Packing material: 1/4 in iodoform gauze  Patient tolerance: Patient tolerated the procedure well with no immediate complications.       1. Bronchitis with bronchospasm   2. Abscess of chest wall     Plan discharge  Devoria Albe, MD, FACEP    MDM          Ward Givens, MD 11/22/12 1356

## 2013-01-30 ENCOUNTER — Emergency Department (HOSPITAL_COMMUNITY): Payer: Medicaid Other

## 2013-01-30 ENCOUNTER — Emergency Department (HOSPITAL_COMMUNITY)
Admission: EM | Admit: 2013-01-30 | Discharge: 2013-01-30 | Disposition: A | Discharge status: Home / Self Care | Payer: Medicaid Other | Attending: Emergency Medicine | Admitting: Emergency Medicine

## 2013-01-30 ENCOUNTER — Encounter (HOSPITAL_COMMUNITY): Payer: Self-pay | Admitting: Emergency Medicine

## 2013-01-30 DIAGNOSIS — Z87891 Personal history of nicotine dependence: Secondary | ICD-10-CM | POA: Insufficient documentation

## 2013-01-30 DIAGNOSIS — L259 Unspecified contact dermatitis, unspecified cause: Secondary | ICD-10-CM | POA: Insufficient documentation

## 2013-01-30 DIAGNOSIS — R059 Cough, unspecified: Secondary | ICD-10-CM | POA: Insufficient documentation

## 2013-01-30 DIAGNOSIS — R062 Wheezing: Secondary | ICD-10-CM | POA: Insufficient documentation

## 2013-01-30 DIAGNOSIS — R05 Cough: Secondary | ICD-10-CM | POA: Insufficient documentation

## 2013-01-30 DIAGNOSIS — J209 Acute bronchitis, unspecified: Secondary | ICD-10-CM | POA: Insufficient documentation

## 2013-01-30 DIAGNOSIS — J3489 Other specified disorders of nose and nasal sinuses: Secondary | ICD-10-CM | POA: Insufficient documentation

## 2013-01-30 DIAGNOSIS — Z87440 Personal history of urinary (tract) infections: Secondary | ICD-10-CM | POA: Insufficient documentation

## 2013-01-30 DIAGNOSIS — Z8742 Personal history of other diseases of the female genital tract: Secondary | ICD-10-CM | POA: Insufficient documentation

## 2013-01-30 DIAGNOSIS — J40 Bronchitis, not specified as acute or chronic: Secondary | ICD-10-CM

## 2013-01-30 MED ORDER — IPRATROPIUM BROMIDE 0.02 % IN SOLN
0.5000 mg | Freq: Once | RESPIRATORY_TRACT | Status: AC
  Administered 2013-01-30: 0.5 mg via RESPIRATORY_TRACT
  Filled 2013-01-30: qty 2.5

## 2013-01-30 MED ORDER — PREDNISONE 20 MG PO TABS: 40.0000 mg | ORAL_TABLET | Freq: Every day | ORAL | Status: DC

## 2013-01-30 MED ORDER — PREDNISONE 20 MG PO TABS
60.0000 mg | ORAL_TABLET | Freq: Once | ORAL | Status: AC
  Administered 2013-01-30: 60 mg via ORAL
  Filled 2013-01-30: qty 3

## 2013-01-30 MED ORDER — ALBUTEROL SULFATE HFA 108 (90 BASE) MCG/ACT IN AERS
1.0000 | INHALATION_SPRAY | RESPIRATORY_TRACT | Status: DC | PRN
  Administered 2013-01-30: 2 via RESPIRATORY_TRACT
  Filled 2013-01-30: qty 6.7

## 2013-01-30 MED ORDER — ALBUTEROL SULFATE (5 MG/ML) 0.5% IN NEBU
5.0000 mg | INHALATION_SOLUTION | Freq: Once | RESPIRATORY_TRACT | Status: AC
  Administered 2013-01-30: 5 mg via RESPIRATORY_TRACT
  Filled 2013-01-30: qty 1

## 2013-01-30 MED ORDER — ALBUTEROL SULFATE (5 MG/ML) 0.5% IN NEBU: 5.0000 mg | INHALATION_SOLUTION | Freq: Once | RESPIRATORY_TRACT | Status: DC

## 2013-01-30 NOTE — Progress Notes (Signed)
[facility] has seen patient and provided her with a oc application. °

## 2013-01-30 NOTE — ED Provider Notes (Signed)
History     CSN: [id number]  Arrival date & time 01/30/13  4098   First MD Initiated Contact with Patient 01/30/13 [phone]      Chief Complaint  Patient presents with  . Shortness of Breath    (Consider location/radiation/quality/duration/timing/severity/associated sxs/prior treatment) Patient is a 42 y.o. female presenting with shortness of breath. The history is provided by the patient.  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 Worsened by:  Activity and exertion Associated symptoms: cough, sputum production and wheezing   Associated symptoms: no abdominal pain, no chest pain, no fever and no vomiting   Risk factors: no tobacco use   Risk factors comment:  No hx of asthma   Past Medical History  Diagnosis Date  . UTI (urinary tract infection)   . Eczema   . Abnormal Pap smear 98    cryo therapy    Past Surgical History  Procedure Laterality Date  . Cesarean section    . Therapeutic abortion      Family History  Problem Relation Age of Onset  . Hypertension Mother   . Diabetes Mother   . Hyperlipidemia Mother   . Heart disease Mother   . Stroke Mother   . Heart disease Maternal Grandmother   . Hypertension Maternal Grandmother   . Diabetes Paternal Grandmother     History  Substance Use Topics  . Smoking status: Former Games developer  . Smokeless tobacco: Never Used  . Alcohol Use: Yes     Comment: socially    OB History   Grav Para Term Preterm Abortions TAB SAB Ect Mult Living   4 3 3  0 1 1 0 0 0 3      Review of Systems  Constitutional: Negative for fever.  Respiratory: Positive for cough, sputum production, shortness of breath and wheezing.   Cardiovascular:  Negative for chest pain.  Gastrointestinal: Negative for nausea, vomiting and abdominal pain.       Post tussive emesis  All other systems reviewed and are negative.    Allergies  Penicillins and Flagyl  Home Medications   Current Outpatient Rx  Name  Route  Sig  Dispense  Refill  . acetaminophen (TYLENOL) 500 MG tablet   Oral   Take 1,000 mg by mouth every 6 (six) hours as needed for pain (For headahce.).         Marland Kitchen clindamycin (CLEOCIN) 150 MG capsule   Oral   Take 2 capsules (300 mg total) by mouth 2 (two) times daily.   14 capsule   0   . ibuprofen (ADVIL,MOTRIN) 200 MG tablet   Oral   Take 3 tablets (600 mg total) by mouth every 6 (six) hours as needed. For pain.   30 tablet   0   . neomycin-bacitracin-polymyxin (NEOSPORIN) ointment   Topical   Apply 1 application topically every 12 (twelve) hours as needed. apply to eye         . traMADol (ULTRAM) 50 MG tablet   Oral   Take 2 tablets (100 mg total) by mouth every 6 (six) hours as needed for pain.   16 tablet   0     BP 140/84  Pulse 88  Temp(Src) 98.3 F (36.8  C) (Oral)  Resp 20  SpO2 96%  LMP 01/18/2013  Physical Exam  Nursing note and vitals reviewed. Constitutional: She is oriented to person, place, and time. She appears well-developed and well-nourished. No distress.  HENT:  Head: Normocephalic and atraumatic.  Mouth/Throat: Oropharynx is clear and moist.  Eyes: Conjunctivae and EOM are normal. Pupils are equal, round, and reactive to light.  Neck: Normal range of motion. Neck supple.  Cardiovascular: Normal rate, regular rhythm and intact distal pulses.   No murmur heard. Pulmonary/Chest: Effort normal. No respiratory distress. She has decreased breath sounds. She has wheezes. She has no rales.  Abdominal: Soft. She exhibits no distension. There is no tenderness. There is no rebound and no guarding.  Musculoskeletal: Normal range of motion. She exhibits no edema and no tenderness.   Neurological: She is alert and oriented to person, place, and time.  Skin: Skin is warm and dry. No rash noted. No erythema.  Psychiatric: She has a normal mood and affect. Her behavior is normal.    ED Course  Procedures (including critical care time)  Labs Reviewed - No data to display Dg Chest 2 View (if Patient Has Fever And/or Copd)  01/30/2013   *RADIOLOGY REPORT*  Clinical Data: Cough since last Tuesday.  Shortness of breath. Nonsmoker.  CHEST - 2 VIEW  Comparison: 07/15/2011  Findings: Midline trachea.  Normal heart size and mediastinal contours.  Sharp costophrenic angles.  No pneumothorax.   Clear lungs.  IMPRESSION: No acute cardiopulmonary disease.   Original Report Authenticated By: Jeronimo Greaves, M.D.     1. Bronchitis       MDM   Patient with symptoms consistent with reactive airways/bronchitis. She has diffuse decreased breath sounds with occasional wheeze. She denies any infectious etiology. 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 She was using an inhaler however it ran out yesterday and her symptoms are not improving. Patient given albuterol, Atrovent and steroids. Chest x-ray pending  9:26 AM Chest x-ray within normal limits and patient symptoms are much improved after albuterol and Atrovent.      Gwyneth Sprout, MD 01/30/13 [phone]

## 2013-01-30 NOTE — ED Notes (Signed)
Pt presenting to ed with c/o productive cough with shortness of breath x 4 days pt states she is coughing up brownish colored sputum pt states chest pain only with cough. Pt states she has bronchitis.

## 2013-04-03 ENCOUNTER — Encounter (HOSPITAL_COMMUNITY): Payer: Self-pay

## 2013-04-03 ENCOUNTER — Inpatient Hospital Stay (HOSPITAL_COMMUNITY)
Admission: EM | Admit: 2013-04-03 | Discharge: 2013-04-04 | DRG: 202 | Disposition: A | Discharge status: Home / Self Care | Payer: Medicaid Other | Attending: Internal Medicine | Admitting: Internal Medicine

## 2013-04-03 ENCOUNTER — Emergency Department (HOSPITAL_COMMUNITY): Payer: Medicaid Other

## 2013-04-03 DIAGNOSIS — Z791 Long term (current) use of non-steroidal anti-inflammatories (NSAID): Secondary | ICD-10-CM

## 2013-04-03 DIAGNOSIS — J9600 Acute respiratory failure, unspecified whether with hypoxia or hypercapnia: Secondary | ICD-10-CM | POA: Diagnosis present

## 2013-04-03 DIAGNOSIS — R0902 Hypoxemia: Secondary | ICD-10-CM

## 2013-04-03 DIAGNOSIS — J9601 Acute respiratory failure with hypoxia: Secondary | ICD-10-CM | POA: Diagnosis present

## 2013-04-03 DIAGNOSIS — E876 Hypokalemia: Secondary | ICD-10-CM | POA: Diagnosis present

## 2013-04-03 DIAGNOSIS — Z79899 Other long term (current) drug therapy: Secondary | ICD-10-CM

## 2013-04-03 DIAGNOSIS — L259 Unspecified contact dermatitis, unspecified cause: Secondary | ICD-10-CM | POA: Diagnosis present

## 2013-04-03 DIAGNOSIS — J45901 Unspecified asthma with (acute) exacerbation: Principal | ICD-10-CM | POA: Diagnosis present

## 2013-04-03 DIAGNOSIS — Z87891 Personal history of nicotine dependence: Secondary | ICD-10-CM

## 2013-04-03 LAB — TSH: TSH: 0.431 u[IU]/mL (ref 0.350–4.500)

## 2013-04-03 LAB — BASIC METABOLIC PANEL
BUN: 7 mg/dL (ref 6–23)
CO2: 21 mEq/L (ref 19–32)
Chloride: 101 mEq/L (ref 96–112)
Creatinine, Ser: 0.71 mg/dL (ref 0.50–1.10)
Glucose, Bld: 214 mg/dL — ABNORMAL HIGH (ref 70–99)
Potassium: 2.9 mEq/L — ABNORMAL LOW (ref 3.5–5.1)

## 2013-04-03 LAB — COMPREHENSIVE METABOLIC PANEL
Albumin: 3.5 g/dL (ref 3.5–5.2)
BUN: 6 mg/dL (ref 6–23)
Calcium: 8.9 mg/dL (ref 8.4–10.5)
GFR calc Af Amer: >90 mL/min (ref >90)
Glucose, Bld: 227 mg/dL — ABNORMAL HIGH (ref 70–99)
Total Protein: 8.4 g/dL — ABNORMAL HIGH (ref 6.0–8.3)

## 2013-04-03 LAB — CBC WITH DIFFERENTIAL/PLATELET
Eosinophils Absolute: 0 10*3/uL (ref 0.0–0.7)
HCT: 40.1 % (ref 36.0–46.0)
Hemoglobin: 14.5 g/dL (ref 12.0–15.0)
Lymphocytes Relative: 6 % — ABNORMAL LOW (ref 12–46)
Lymphs Abs: 0.5 10*3/uL — ABNORMAL LOW (ref 0.7–4.0)
Lymphs Abs: 0.3 10*3/uL — ABNORMAL LOW (ref 0.7–4.0)
MCH: 29.2 pg (ref 26.0–34.0)
MCHC: 36.2 g/dL — ABNORMAL HIGH (ref 30.0–36.0)
Monocytes Absolute: 0.1 10*3/uL (ref 0.1–1.0)
Monocytes Relative: 2 % — ABNORMAL LOW (ref 3–12)
Neutro Abs: 3.2 10*3/uL (ref 1.7–7.7)
Neutro Abs: 3.9 10*3/uL (ref 1.7–7.7)
Neutrophils Relative %: 82 % — ABNORMAL HIGH (ref 43–77)
Neutrophils Relative %: 92 % — ABNORMAL HIGH (ref 43–77)
Platelets: 235 10*3/uL (ref 150–400)
RBC: 4.97 MIL/uL (ref 3.87–5.11)
RBC: 4.67 MIL/uL (ref 3.87–5.11)
WBC: 4.2 10*3/uL (ref 4.0–10.5)

## 2013-04-03 LAB — APTT: aPTT: 35 seconds (ref 24–37)

## 2013-04-03 LAB — PROTIME-INR
INR: 1.27 (ref 0.00–1.49)
Prothrombin Time: 15.6 seconds — ABNORMAL HIGH (ref 11.6–15.2)

## 2013-04-03 LAB — MAGNESIUM: Magnesium: 2.6 mg/dL — ABNORMAL HIGH (ref 1.5–2.5)

## 2013-04-03 LAB — PHOSPHORUS: Phosphorus: 1.9 mg/dL — ABNORMAL LOW (ref 2.3–4.6)

## 2013-04-03 MED ORDER — SODIUM CHLORIDE 0.9 % IV SOLN: INTRAVENOUS | Status: DC

## 2013-04-03 MED ORDER — ALBUTEROL SULFATE (5 MG/ML) 0.5% IN NEBU
2.5000 mg | INHALATION_SOLUTION | Freq: Four times a day (QID) | RESPIRATORY_TRACT | Status: DC
  Administered 2013-04-03 – 2013-04-04 (×5): 2.5 mg via RESPIRATORY_TRACT
  Filled 2013-04-03 (×3): qty 0.5

## 2013-04-03 MED ORDER — ONDANSETRON HCL 4 MG/2ML IJ SOLN: 4.0000 mg | Freq: Four times a day (QID) | INTRAMUSCULAR | Status: DC | PRN

## 2013-04-03 MED ORDER — ACETAMINOPHEN 650 MG RE SUPP: 650.0000 mg | Freq: Four times a day (QID) | RECTAL | Status: DC | PRN

## 2013-04-03 MED ORDER — ONDANSETRON HCL 4 MG PO TABS: 4.0000 mg | ORAL_TABLET | Freq: Four times a day (QID) | ORAL | Status: DC | PRN

## 2013-04-03 MED ORDER — GUAIFENESIN ER 600 MG PO TB12
600.0000 mg | ORAL_TABLET | Freq: Two times a day (BID) | ORAL | Status: DC
  Administered 2013-04-03 – 2013-04-04 (×3): 600 mg via ORAL
  Filled 2013-04-03 (×4): qty 1

## 2013-04-03 MED ORDER — ALBUTEROL SULFATE (5 MG/ML) 0.5% IN NEBU
5.0000 mg | INHALATION_SOLUTION | Freq: Once | RESPIRATORY_TRACT | Status: AC
  Administered 2013-04-03: 5 mg via RESPIRATORY_TRACT
  Filled 2013-04-03: qty 1

## 2013-04-03 MED ORDER — ALBUTEROL (5 MG/ML) CONTINUOUS INHALATION SOLN
15.0000 mg/h | INHALATION_SOLUTION | Freq: Once | RESPIRATORY_TRACT | Status: DC
  Filled 2013-04-03: qty 20

## 2013-04-03 MED ORDER — GUAIFENESIN-DM 100-10 MG/5ML PO SYRP: 5.0000 mL | ORAL_SOLUTION | ORAL | Status: DC | PRN

## 2013-04-03 MED ORDER — IPRATROPIUM BROMIDE 0.02 % IN SOLN
0.5000 mg | Freq: Once | RESPIRATORY_TRACT | Status: AC
  Administered 2013-04-03: 0.5 mg via RESPIRATORY_TRACT
  Filled 2013-04-03: qty 2.5

## 2013-04-03 MED ORDER — PREDNISONE 50 MG PO TABS
50.0000 mg | ORAL_TABLET | Freq: Every day | ORAL | Status: DC
  Administered 2013-04-04: 50 mg via ORAL
  Filled 2013-04-03 (×2): qty 1

## 2013-04-03 MED ORDER — ACETAMINOPHEN 325 MG PO TABS
650.0000 mg | ORAL_TABLET | Freq: Four times a day (QID) | ORAL | Status: DC | PRN
  Administered 2013-04-03: 650 mg via ORAL
  Filled 2013-04-03: qty 2

## 2013-04-03 MED ORDER — IPRATROPIUM BROMIDE 0.02 % IN SOLN: 0.5000 mg | RESPIRATORY_TRACT | Status: DC | PRN

## 2013-04-03 MED ORDER — PREDNISONE 20 MG PO TABS
60.0000 mg | ORAL_TABLET | Freq: Once | ORAL | Status: AC
  Administered 2013-04-03: 60 mg via ORAL
  Filled 2013-04-03: qty 3

## 2013-04-03 MED ORDER — ALBUTEROL (5 MG/ML) CONTINUOUS INHALATION SOLN
15.0000 mg/h | INHALATION_SOLUTION | Freq: Once | RESPIRATORY_TRACT | Status: AC
  Administered 2013-04-03: 15 mg/h via RESPIRATORY_TRACT
  Filled 2013-04-03: qty 20

## 2013-04-03 MED ORDER — IPRATROPIUM BROMIDE 0.02 % IN SOLN
0.5000 mg | Freq: Four times a day (QID) | RESPIRATORY_TRACT | Status: DC
  Administered 2013-04-03 – 2013-04-04 (×5): 0.5 mg via RESPIRATORY_TRACT
  Filled 2013-04-03 (×3): qty 2.5

## 2013-04-03 MED ORDER — MAGNESIUM SULFATE 40 MG/ML IJ SOLN
2.0000 g | Freq: Once | INTRAMUSCULAR | Status: AC
  Administered 2013-04-03: 2 g via INTRAVENOUS
  Filled 2013-04-03: qty 50

## 2013-04-03 MED ORDER — POTASSIUM CHLORIDE CRYS ER 20 MEQ PO TBCR
40.0000 meq | EXTENDED_RELEASE_TABLET | Freq: Once | ORAL | Status: AC
  Administered 2013-04-03: 40 meq via ORAL
  Filled 2013-04-03: qty 2

## 2013-04-03 MED ORDER — SODIUM CHLORIDE 0.9 % IV SOLN
INTRAVENOUS | Status: DC
  Administered 2013-04-03: 11:00:00 via INTRAVENOUS

## 2013-04-03 MED ORDER — HYDROCODONE-ACETAMINOPHEN 5-325 MG PO TABS
1.0000 | ORAL_TABLET | ORAL | Status: DC | PRN
  Administered 2013-04-03: 2 via ORAL
  Filled 2013-04-03: qty 2

## 2013-04-03 MED ORDER — ALBUTEROL SULFATE (5 MG/ML) 0.5% IN NEBU: 5.0000 mg | INHALATION_SOLUTION | RESPIRATORY_TRACT | Status: AC | PRN

## 2013-04-03 NOTE — Progress Notes (Signed)
[facility] provided patient with a [employer] and a list of primary care resources in [place]. Patient stated that she would be getting insurance through her job in November.

## 2013-04-03 NOTE — Plan of Care (Signed)
Problem: Phase I Progression Outcomes Goal: Flu/PneumoVaccines if indicated Outcome: Progressing Gave pt information on pneumonia vaccine, she is going to think about it.

## 2013-04-03 NOTE — Plan of Care (Signed)
Problem: Phase I Progression Outcomes Goal: O2 sats > or equal 90% or at baseline Outcome: Progressing 98% ra

## 2013-04-03 NOTE — ED Notes (Signed)
MD at bedside. 

## 2013-04-03 NOTE — ED Provider Notes (Signed)
CSN: [id number]     Arrival date & time 04/03/13  0818 History     First MD Initiated Contact with Patient 04/03/13 [phone]     Chief Complaint  Patient presents with  . Shortness of Breath   (Consider location/radiation/quality/duration/timing/severity/associated sxs/prior Treatment) HPI  Patient reports when she grew up her mother always smoked heavily and she most recently she moved in with her father who was diagnosed with a brain tumor to help take care of him and everybody smokes in that house. She states about a year ago she started having problems with her breathing with wheezing and shortness of breath. She reports most recently 4 days ago she started having wheezing, shortness of breath cough chest tightness, and coughing productive of clear sputum. She denies rhinorrhea, sore throat, nausea, vomiting, or diarrhea. She also denies fever. She states it gets much worse at night and she is unable to lay down. She states she has had to sleep in the recliner because she is unable to lay down to sleep. She states she has been using her inhaler but only gets temporary relief. She states she's used it 3 times a day since her symptoms got worse 4 days ago.  PCP none  Past Medical History  Diagnosis Date  . UTI (urinary tract infection)   . Eczema   . Abnormal Pap smear 98    cryo therapy   Past Surgical History  Procedure Laterality Date  . Cesarean section    . Therapeutic abortion     Family History  Problem Relation Age of Onset  . Hypertension Mother   . Diabetes Mother   . Hyperlipidemia Mother   . Heart disease Mother   . Stroke Mother   . Heart disease Maternal Grandmother   . Hypertension Maternal Grandmother   . Diabetes Paternal Grandmother    History  Substance Use Topics  . Smoking status: Former Games developer  . Smokeless tobacco: Never Used  . Alcohol Use: Yes     Comment: socially   Employed Living with father and uncle   OB History   Grav Para Term Preterm  Abortions TAB SAB Ect Mult Living   4 3 3  0 1 1 0 0 0 3     Review of Systems  All other systems reviewed and are negative.    Allergies  Penicillins and Flagyl  Home Medications   Current Outpatient Rx  Name  Route  Sig  Dispense  Refill  . acetaminophen (TYLENOL) 500 MG tablet   Oral   Take 1,000 mg by mouth every 6 (six) hours as needed for pain (For headahce.).         Marland Kitchen ibuprofen (ADVIL,MOTRIN) 200 MG tablet   Oral   Take 3 tablets (600 mg total) by mouth every 6 (six) hours as needed. For pain.   30 tablet   0    BP 133/79  Pulse 88  Resp 17  SpO2 99%  Vital signs normal    Physical Exam  Nursing note and vitals reviewed. Constitutional: She is oriented to person, place, and time. She appears well-developed and well-nourished.  Non-toxic appearance. She does not appear ill. No distress.  HENT:  Head: Normocephalic and atraumatic.  Right Ear: External ear normal.  Left Ear: External ear normal.  Nose: Nose normal. No mucosal edema or rhinorrhea.  Mouth/Throat: Oropharynx is clear and moist and mucous membranes are normal. No dental abscesses or edematous.  Eyes: Conjunctivae and EOM are normal. Pupils are  equal, round, and reactive to light.  Neck: Normal range of motion and full passive range of motion without pain. Neck supple.  Cardiovascular: Normal rate, regular rhythm and normal heart sounds.  Exam reveals no gallop and no friction rub.   No murmur heard. Pulmonary/Chest: Accessory muscle usage present. Tachypnea noted. She is in respiratory distress. She has decreased breath sounds. She has wheezes. She has no rhonchi. She has no rales. She exhibits no tenderness and no crepitus.  Abdominal: Soft. Normal appearance and bowel sounds are normal. She exhibits no distension. There is no tenderness. There is no rebound and no guarding.  Musculoskeletal: Normal range of motion. She exhibits no edema and no tenderness.  Moves all extremities well.    Neurological: She is alert and oriented to person, place, and time. She has normal strength. No cranial nerve deficit.  Skin: Skin is warm, dry and intact. No rash noted. No erythema. No pallor.  Psychiatric: She has a normal mood and affect. Her speech is normal and behavior is normal. Her mood appears not anxious.    ED Course   Medications  0.9 %  sodium chloride infusion ( Intravenous New Bag/Given 04/03/13 1109)  magnesium sulfate IVPB 2 g 50 mL (2 g Intravenous New Bag/Given 04/03/13 1207)  potassium chloride SA (K-DUR,KLOR-CON) CR tablet 40 mEq (not administered)  predniSONE (DELTASONE) tablet 60 mg (60 mg Oral Given 04/03/13 0848)  albuterol (PROVENTIL,VENTOLIN) solution continuous neb (15 mg/hr Nebulization Given 04/03/13 0854)  ipratropium (ATROVENT) nebulizer solution 0.5 mg (0.5 mg Nebulization Given 04/03/13 0854)  albuterol (PROVENTIL) (5 MG/ML) 0.5% nebulizer solution 5 mg (5 mg Nebulization Given 04/03/13 1112)  ipratropium (ATROVENT) nebulizer solution 0.5 mg (0.5 mg Nebulization Given 04/03/13 1112)     Procedures (including critical care time)  Recheck 10:25 just finished continuous nebulizer. States she is alittle better. Has improved air movement, but still diminished. No wheezing or rhonchi. Will have her ambulate and see how she does.   10:46 Pt ambulated, pulse ox dropped to 83 % on RA and HR was 118. We discussed need for admission, IV started and another nebulizer ordered.   12:22 Dr Elisabeth Pigeon, admit to med-surg 3 [facility] Team 8  Results for orders placed during the hospital encounter of 04/03/13  CBC WITH DIFFERENTIAL      Result Value Range   WBC 3.9 (*) 4.0 - 10.5 K/uL   RBC 4.97  3.87 - 5.11 MIL/uL   Hemoglobin 14.5  12.0 - 15.0 g/dL   HCT 16.1  09.6 - 04.5 %   MCV 80.7  78.0 - 100.0 fL   MCH 29.2  26.0 - 34.0 pg   MCHC 36.2 (*) 30.0 - 36.0 g/dL   RDW 40.9  81.1 - 91.4 %   Platelets 202  150 - 400 K/uL   Neutrophils Relative % 82 (*) 43 - 77 %   Neutro Abs  3.2  1.7 - 7.7 K/uL   Lymphocytes Relative 14  12 - 46 %   Lymphs Abs 0.5 (*) 0.7 - 4.0 K/uL   Monocytes Relative 2 (*) 3 - 12 %   Monocytes Absolute 0.1  0.1 - 1.0 K/uL   Eosinophils Relative 3  0 - 5 %   Eosinophils Absolute 0.1  0.0 - 0.7 K/uL   Basophils Relative 0  0 - 1 %   Basophils Absolute 0.0  0.0 - 0.1 K/uL  BASIC METABOLIC PANEL      Result Value Range   Sodium 134 (*)  135 - 145 mEq/L   Potassium 2.9 (*) 3.5 - 5.1 mEq/L   Chloride 101  96 - 112 mEq/L   CO2 21  19 - 32 mEq/L   Glucose, Bld 214 (*) 70 - 99 mg/dL   BUN 7  6 - 23 mg/dL   Creatinine, Ser 1.61  0.50 - 1.10 mg/dL   Calcium 9.2  8.4 - 09.6 mg/dL   GFR calc non Af Amer >90  >90 mL/min   GFR calc Af Amer >90  >90 mL/min   Laboratory interpretation all normal except hypokalemia   Dg Chest 2 View  04/03/2013   *RADIOLOGY REPORT*  Clinical Data: Shortness of breath  CHEST - 2 VIEW  Comparison: 01/30/2013  Findings: The heart and pulmonary vascularity are within normal limits.  The lungs are clear bilaterally.  No acute bony abnormality is seen.  IMPRESSION: No acute abnormality noted.   Original Report Authenticated By: Alcide Clever, M.D.      1. Asthma exacerbation   2. Hypoxia   3. Acute asthma exacerbation   4. Hypokalemia   5. Hypokalemia with normal acid-base balance      Plan admission   Devoria Albe, MD, FACEP   CRITICAL CARE Performed by: Devoria Albe L Total critical care time: 31 min Critical care time was exclusive of separately billable procedures and treating other patients. Critical care was necessary to treat or prevent imminent or life-threatening deterioration. Critical care was time spent personally by me on the following activities: development of treatment plan with patient and/or surrogate as well as nursing, discussions with consultants, evaluation of patient's response to treatment, examination of patient, obtaining history from patient or surrogate, ordering and performing treatments  and interventions, ordering and review of laboratory studies, ordering and review of radiographic studies, pulse oximetry and re-evaluation of patient's condition.  MDM    Ward Givens, MD 04/03/13 1227

## 2013-04-03 NOTE — Plan of Care (Signed)
Problem: Phase I Progression Outcomes Goal: Pain controlled Outcome: Progressing Pt c/o of headache, medicated with tylenol

## 2013-04-03 NOTE — ED Notes (Signed)
Patient reports to the emergency department for SOB starting last Thursday, for which she attempted to treat with an inhaler. Patient reports that recently she has been unable to sleep at night without sitting up. Patient reports that last night she could not get any rest and that today she could not "do minor tasks like walking."

## 2013-04-03 NOTE — Progress Notes (Signed)
Pt's respirations unlabored, no wheezing noted. Pt visiting with family at bedside. She states she's feeling much better

## 2013-04-03 NOTE — Care Management Note (Signed)
   CARE MANAGEMENT NOTE 04/03/2013  Patient:  NATAHLIA, HOGGARD ADRIENNE   Account Number:  [id number]  Date Initiated:  04/03/2013  Documentation initiated by:  Hanson Medeiros  Subjective/Objective Assessment:   42 yo female admitted with acute hypoxia & asthma exacerbation. No PCP on record.     Action/Plan:   Home when stable   Anticipated DC Date:     Anticipated DC Plan:  HOME/SELF CARE  In-house referral  Editor, commissioning  CM consult  [facility]igent [facility]  [facility]      Choice offered to / List presented to:  NA   DME arranged  NA      DME agency  NA     HH arranged  NA      HH agency  NA   Status of service:  In process, will continue to follow Medicare Important Message given?   (If response is "NO", the following Medicare IM given date fields will be blank) Date Medicare IM given:   Date Additional Medicare IM given:    Discharge Disposition:    Per UR Regulation:  Reviewed for med. necessity/level of care/duration of stay  If discussed at Long Length of Stay Meetings, dates discussed:    Comments:  04/03/13 1421 Chayse Zatarain,RN,MSN [phone] Chart reviewed for utilization of services. No PCP. Cm to provide pt with information concerning [facility] & other community indigent providers.

## 2013-04-03 NOTE — H&P (Signed)
[facility] History and Physical  Kristin Mason UJW:[id number] DOB: 03-10-1971 DOA: 04/03/2013  Referring physician: ER physician PCP: No primary provider on file.   Chief Complaint: shortness of breath  HPI:  42 year old female with past medical history significant for asthma, uses only a rescue inhaler who presented to [facility] ED with worsening shortness of breath associated with productive cough of clear sputum and wheezing for about a couple of days prior to this admission. She is exposed to a smoking environment, her family smokes in the house. Patient also reported having chest tightness when she coughs but is chest pain-free when she does not cough. No palpitations. No complaints of fevers or chills. No abdominal pain, nausea or vomiting. No diarrhea or constipation. She didn't have complaints of lightheadedness or dizziness but in emergency room when she stood up she felt lightheaded. No loss of consciousness. In ED, vital signs are stable, blood pressure 133/79, heart rate 88, oxygen saturation 94% on room air. Oxygen saturation dropped to 83% on ambulation. Patient received total of 4 albuterol nebulizer treatments and 2 Atrovent nebulizer treatments and she continues to have wheezing and shortness of breath. In addition she was given prednisone 60 mg by mouth. BMP revealed potassium of 2.9 which was repleted in emergency room with 40 mEq of by mouth potassium. Blood work essentially unremarkable. Chest x-ray did not reveal acute cardiopulmonary process.  Assessment and Plan:  Principal Problem:   Acute respiratory failure with hypoxia - Secondary to acute asthma exacerbation - Continue nebulizer treatments with albuterol and Atrovent, every 2 hours as needed and every 6 hours scheduled - Oxygen support via nasal cannula to keep oxygen saturation above 90% - Will use prednisone 50 mg daily for steroids   Acute asthma exacerbation - Management as above with nebulizer  treatments and steroids  Active Problems:   Hypokalemia - Unclear ideology - Repleted in ED  Manson Passey [facility] [phone]  Review of Systems:  Constitutional: Negative for fever, chills and malaise/fatigue. Negative for diaphoresis.  HENT: Negative for hearing loss, ear pain, nosebleeds, congestion, sore throat, neck pain, tinnitus and ear discharge.   Eyes: Negative for blurred vision, double vision, photophobia, pain, discharge and redness.  Respiratory: per HPI.   Cardiovascular: Negative for chest pain, palpitations, orthopnea, claudication and leg swelling.  Gastrointestinal: Negative for nausea, vomiting and abdominal pain. Negative for heartburn, constipation, blood in stool and melena.  Genitourinary: Negative for dysuria, urgency, frequency, hematuria and flank pain.  Musculoskeletal: Negative for myalgias, back pain, joint pain and falls.  Skin: Negative for itching and rash.  Neurological: Negative for dizziness and weakness. Negative for tingling, tremors, sensory change, speech change, focal weakness, loss of consciousness and headaches.  Endo/Heme/Allergies: Negative for environmental allergies and polydipsia. Does not bruise/bleed easily.  Psychiatric/Behavioral: Negative for suicidal ideas. The patient is not nervous/anxious.      Past Medical History  Diagnosis Date  . UTI (urinary tract infection)   . Eczema   . Abnormal Pap smear 98    cryo therapy   Past Surgical History  Procedure Laterality Date  . Cesarean section    . Therapeutic abortion     Social History:  reports that she has quit smoking. She has never used smokeless tobacco. She reports that  drinks alcohol. She reports that she does not use illicit drugs.  Allergies  Allergen Reactions  . Penicillins Anaphylaxis, Shortness Of Breath and Swelling  . Flagyl [Metronidazole] Swelling    Made feet blue and numb  Family History  Problem Relation Age of Onset  . Hypertension Mother   . Diabetes  Mother   . Hyperlipidemia Mother   . Heart disease Mother   . Stroke Mother   . Heart disease Maternal Grandmother   . Hypertension Maternal Grandmother   . Diabetes Paternal Grandmother      Prior to Admission medications   Medication Sig Start Date End Date Taking? Authorizing Provider  albuterol (PROVENTIL HFA;VENTOLIN HFA) 108 (90 BASE) MCG/ACT inhaler Inhale 2 puffs into the lungs every 6 (six) hours as needed for wheezing.   Yes Historical Provider, MD  ibuprofen (ADVIL,MOTRIN) 200 MG tablet Take 400 mg by mouth every 6 (six) hours as needed for pain.   Yes Historical Provider, MD   Physical Exam: Filed Vitals:   04/03/13 0830 04/03/13 1048 04/03/13 1115  BP: 133/79    Pulse: 88 112   Resp: 17 16   SpO2: 99% 94% 97%    Physical Exam  Constitutional: Appears well-developed and well-nourished. No distress.  HENT: Normocephalic. External right and left ear normal. Oropharynx is clear and moist.  Eyes: Conjunctivae and EOM are normal. PERRLA, no scleral icterus.  Neck: Normal ROM. Neck supple. No JVD. No tracheal deviation. No thyromegaly.  CVS: RRR, S1/S2 +, no murmurs, no gallops, no carotid bruit.  Pulmonary: wheezing bilaterally, no rhonchi.  Abdominal: Soft. BS +,  no distension, tenderness, rebound or guarding.  Musculoskeletal: Normal range of motion. No edema and no tenderness.  Lymphadenopathy: No lymphadenopathy noted, cervical, inguinal. Neuro: Alert. Normal reflexes, muscle tone coordination. No cranial nerve deficit. Skin: Skin is warm and dry. No rash noted. Not diaphoretic. No erythema. No pallor.  Psychiatric: Normal mood and affect. Behavior, judgment, thought content normal.   Labs on Admission:  Basic Metabolic Panel:  Recent Labs Lab 04/03/13 1104  NA 134*  K 2.9*  CL 101  CO2 21  GLUCOSE 214*  BUN 7  CREATININE 0.71  CALCIUM 9.2   Liver Function Tests: No results found for this basename: AST, ALT, ALKPHOS, BILITOT, PROT, ALBUMIN,  in the  last 168 hours No results found for this basename: LIPASE, AMYLASE,  in the last 168 hours No results found for this basename: AMMONIA,  in the last 168 hours CBC:  Recent Labs Lab 04/03/13 1104  WBC 3.9*  NEUTROABS 3.2  HGB 14.5  HCT 40.1  MCV 80.7  PLT 202   Cardiac Enzymes: No results found for this basename: CKTOTAL, CKMB, CKMBINDEX, TROPONINI,  in the last 168 hours BNP: No components found with this basename: POCBNP,  CBG: No results found for this basename: GLUCAP,  in the last 168 hours  Radiological Exams on Admission: Dg Chest 2 View  04/03/2013   *RADIOLOGY REPORT*  Clinical Data: Shortness of breath  CHEST - 2 VIEW  Comparison: 01/30/2013  Findings: The heart and pulmonary vascularity are within normal limits.  The lungs are clear bilaterally.  No acute bony abnormality is seen.  IMPRESSION: No acute abnormality noted.   Original Report Authenticated By: Alcide Clever, M.D.    EKG: Normal sinus rhythm, no ST/T wave changes  Code Status: Full Family Communication: Pt at bedside Disposition Plan: Admit for further evaluation  Manson Passey, MD  [facility] Pager [phone]  If 7PM-7AM, please contact night-coverage [email or web address] Password [facility] 04/03/2013, 12:30 PM

## 2013-04-03 NOTE — ED Notes (Signed)
Pt. Walked from room 10 to room 13 and as she walked she stated "she felt dizzy and light headed." He oxygen level drop from 94 to 83. Her heart rate was 118. Dr. Lynelle Doctor and RN was made aware.

## 2013-04-04 DIAGNOSIS — J9600 Acute respiratory failure, unspecified whether with hypoxia or hypercapnia: Secondary | ICD-10-CM

## 2013-04-04 LAB — COMPREHENSIVE METABOLIC PANEL
ALT: 10 U/L (ref 0–35)
Calcium: 9 mg/dL (ref 8.4–10.5)
GFR calc Af Amer: >90 mL/min (ref >90)
Glucose, Bld: 122 mg/dL — ABNORMAL HIGH (ref 70–99)
Sodium: 135 mEq/L (ref 135–145)
Total Protein: 7.7 g/dL (ref 6.0–8.3)

## 2013-04-04 LAB — CBC
HCT: 35.6 % — ABNORMAL LOW (ref 36.0–46.0)
Hemoglobin: 12.6 g/dL (ref 12.0–15.0)
MCHC: 35.4 g/dL (ref 30.0–36.0)

## 2013-04-04 LAB — GLUCOSE, CAPILLARY

## 2013-04-04 MED ORDER — ALBUTEROL SULFATE HFA 108 (90 BASE) MCG/ACT IN AERS: 2.0000 | INHALATION_SPRAY | Freq: Four times a day (QID) | RESPIRATORY_TRACT | Status: DC | PRN

## 2013-04-04 MED ORDER — PREDNISONE 50 MG PO TABS: ORAL_TABLET | ORAL | Status: DC

## 2013-04-04 MED ORDER — BUDESONIDE 0.5 MG/2ML IN SUSP: 0.5000 mg | Freq: Two times a day (BID) | RESPIRATORY_TRACT | Status: DC

## 2013-04-04 NOTE — Discharge Summary (Addendum)
Physician Discharge Summary  Kristin Mason ZOX:[id number] DOB: 07-13-1971 DOA: 04/03/2013  PCP: No primary provider on file.  Admit date: 04/03/2013 Discharge date: 04/04/2013  Recommendations for Outpatient Follow-up:  1. please note that you were hospitalized for asthma exacerbation. You received nebulizer treatments and steroids during this hospital stay  2. you may continue taking rescue albuterol inhaler as needed for shortness of breath or wheezing  3. Follow up in our clinic 8/29 at 9 am  4. please followup with pulmonary medicine, schedule the appointment as we discussed to have pulmonary function tests done in determine if you have a diagnosis of asthma  5. please continue taking prednisone 50 mg a day for 5 days and then stop  6. ovoid smoking inhalation environment   Discharge Diagnoses:  Principal Problem:   Acute respiratory failure with hypoxia Active Problems:   Acute asthma exacerbation   Hypokalemia   Discharge Condition: medically stable for discharge home today  Diet recommendation: as toelrated  History of present illness:  42 year old female with past medical history significant for asthma, uses only a rescue inhaler who presented to [facility] ED with worsening shortness of breath associated with productive cough of clear sputum and wheezing for about a couple of days prior to this admission. She is exposed to a smoking environment, her family smokes in the house. Patient also reported having chest tightness when she coughs but is chest pain-free when she does not cough. No palpitations. No complaints of fevers or chills.   In ED, vital signs are stable, blood pressure 133/79, heart rate 88, oxygen saturation 94% on room air. Oxygen saturation dropped to 83% on ambulation. Patient received total of 4 albuterol nebulizer treatments and 2 Atrovent nebulizer treatments and she continues to have wheezing and shortness of breath. In addition she was given  prednisone 60 mg by mouth. BMP revealed potassium of 2.9 which was repleted in emergency room with 40 mEq of by mouth potassium. Blood work essentially unremarkable. Chest x-ray did not reveal acute cardiopulmonary process.   Assessment and Plan:   Principal Problem:  Acute respiratory failure with hypoxia  - Secondary to acute asthma exacerbation  - In hospital she received nebulizer treatments with albuterol and Atrovent, every 2 hours as needed and every 6 hours scheduled  - Respiratory status stable at this time - We will continue albuterol inhaler at the time of discharge and we also prescribed Pulmicort twice daily for long-term asthma control. Referral to pulmonary provided for pulmonary function tests to be done outpatient. - Patient will continue taking prednisone for 5 days 50 mg daily and then she will stop. Acute asthma exacerbation  - Management as above   Active Problems:  Hypokalemia  - Unclear ideology  - Repleted in ED   Discharge Exam: Filed Vitals:   04/04/13 0701  BP: 121/74  Pulse: 84  Temp: 98.3 F (36.8 C)  Resp: 20   Filed Vitals:   04/04/13 0135 04/04/13 0701 04/04/13 0800 04/04/13 0835  BP:  121/74    Pulse: 79 84    Temp:  98.3 F (36.8 C)    TempSrc:  Oral    Resp: 18 20    Height:      Weight:  72.6 kg (160 lb 0.9 oz)    SpO2: 99% 98% 98% 98%    General: Pt is alert, follows commands appropriately, not in acute distress Cardiovascular: Regular rate and rhythm, S1/S2 +, no murmurs, no rubs, no gallops Respiratory: Clear to auscultation  bilaterally, no wheezing, no crackles, no rhonchi Abdominal: Soft, non tender, non distended, bowel sounds +, no guarding Extremities: no edema, no cyanosis, pulses palpable bilaterally DP and PT Neuro: Grossly nonfocal  Discharge Instructions  Discharge Orders   Future Orders Complete By Expires   Call MD for:  difficulty breathing, headache or visual disturbances  As directed    Call MD for:   persistant dizziness or light-headedness  As directed    Call MD for:  persistant nausea and vomiting  As directed    Call MD for:  severe uncontrolled pain  As directed    Diet - low sodium heart healthy  As directed    Discharge instructions  As directed    Comments:     Increase activity slowly  As directed        Medication List         albuterol 108 (90 BASE) MCG/ACT inhaler  Commonly known as:  PROVENTIL HFA;VENTOLIN HFA  Inhale 2 puffs into the lungs every 6 (six) hours as needed for wheezing.     budesonide 0.5 MG/2ML nebulizer solution  Commonly known as:  PULMICORT  Take 2 mLs (0.5 mg total) by nebulization 2 (two) times daily.     ibuprofen 200 MG tablet  Commonly known as:  ADVIL,MOTRIN  Take 400 mg by mouth every 6 (six) hours as needed for pain.     predniSONE 50 MG tablet  Commonly known as:  DELTASONE  Take prednisone 50 mg daily for 5 days and then stop.           Follow-up Information   Schedule an appointment as soon as possible for a visit with [facility].   Contact information:   [address] [place] [address]        The results of significant diagnostics from this hospitalization (including imaging, microbiology, ancillary and laboratory) are listed below for reference.    Significant Diagnostic Studies: Dg Chest 2 View  04/03/2013   *RADIOLOGY REPORT*  Clinical Data: Shortness of breath  CHEST - 2 VIEW  Comparison: 01/30/2013  Findings: The heart and pulmonary vascularity are within normal limits.  The lungs are clear bilaterally.  No acute bony abnormality is seen.  IMPRESSION: No acute abnormality noted.   Original Report Authenticated By: Alcide Clever, M.D.    Microbiology: No results found for this or any previous visit (from the past 240 hour(s)).   Labs: Basic Metabolic Panel:  Recent Labs Lab 04/03/13 1104 04/03/13 1358 04/04/13 0400  NA 134* 136 135  K 2.9* 3.4* 3.3*  CL 101 103 106  CO2 21 20 22    GLUCOSE 214* 227* 122*  BUN 7 6 4*  CREATININE 0.71 0.73 0.64  CALCIUM 9.2 8.9 9.0  MG  --  2.6*  --   PHOS  --  1.9*  --    Liver Function Tests:  Recent Labs Lab 04/03/13 1358 04/04/13 0400  AST 16 13  ALT 13 10  ALKPHOS 49 44  BILITOT 0.2* 0.4  PROT 8.4* 7.7  ALBUMIN 3.5 3.3*   No results found for this basename: LIPASE, AMYLASE,  in the last 168 hours No results found for this basename: AMMONIA,  in the last 168 hours CBC:  Recent Labs Lab 04/03/13 1104 04/03/13 1358 04/04/13 0400  WBC 3.9* 4.2 6.1  NEUTROABS 3.2 3.9  --   HGB 14.5 13.7 12.6  HCT 40.1 37.9 35.6*  MCV 80.7 81.2 81.1  PLT 202  235 220   Cardiac Enzymes: No results found for this basename: CKTOTAL, CKMB, CKMBINDEX, TROPONINI,  in the last 168 hours BNP: BNP (last 3 results) No results found for this basename: PROBNP,  in the last 8760 hours CBG:  Recent Labs Lab 04/04/13 0731  GLUCAP 101*    Time coordinating discharge: Over 30 minutes  Signed:  Manson Passey, MD  [facility]  04/04/2013, 11:42 AM  Pager #: [phone]

## 2013-04-04 NOTE — Care Management Note (Signed)
Cm spoke with patient at the bedside concerning discharge planning. No PCP on record. Pt provided with information concerning [facility]. 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 Unable to afford rx at discharge. Pt eligible for [employer]. Pt explained eligiblity 1x yearly. Pt provided with MATCH Letter prior to discharge. No other barriers identified.   Roxy Manns Crystelle Ferrufino,RN,MSN [phone]

## 2013-04-04 NOTE — Progress Notes (Signed)
Patient education handouts given on Asthma and asthma attack prevention.

## 2013-04-04 NOTE — Progress Notes (Signed)
Pt discharged home in with mother in stable condition. Discharge instructions and scripts given. Pt called and set up appointment at wellness clinic for Fri, Aug. 29th at 8:25. Pt verbalized understanding.

## 2013-04-07 ENCOUNTER — Ambulatory Visit: Payer: Medicaid Other | Attending: Internal Medicine | Admitting: Internal Medicine

## 2013-04-07 ENCOUNTER — Encounter: Payer: Self-pay | Admitting: Internal Medicine

## 2013-04-07 VITALS — BP 120/75 | HR 80 | Temp 98.7°F | Resp 16 | Ht 63.78 in | Wt 158.0 lb

## 2013-04-07 DIAGNOSIS — J4520 Mild intermittent asthma, uncomplicated: Secondary | ICD-10-CM | POA: Insufficient documentation

## 2013-04-07 DIAGNOSIS — J45909 Unspecified asthma, uncomplicated: Secondary | ICD-10-CM

## 2013-04-07 NOTE — Progress Notes (Signed)
Patient ID: Kristin Mason, female   DOB: 1971/01/16, 42 y.o.   MRN: [id number]  CC: New patient  HPI: 42 year old female with no significant past medical history, recent hospitalization for acute asthma exacerbation. Patient presented to the clinic for followup after this hospitalization. She is still taking by mouth prednisone. She did not use albuterol inhaler since that discharge. No wheezing or shortness of breath. No chest tightness. Patient reported her father stopped smoking so she is cautious not to be exposed to smoke inhalation. No abdominal pain, nausea or vomiting. No loss of consciousness.  Allergies  Allergen Reactions  . Penicillins Anaphylaxis, Shortness Of Breath and Swelling  . Flagyl [Metronidazole] Swelling    Made feet blue and numb   Past Medical History  Diagnosis Date  . UTI (urinary tract infection)   . Eczema   . Abnormal Pap smear 98    cryo therapy  . Asthma    Current Outpatient Prescriptions on File Prior to Visit  Medication Sig Dispense Refill  . albuterol (PROVENTIL HFA;VENTOLIN HFA) 108 (90 BASE) MCG/ACT inhaler Inhale 2 puffs into the lungs every 6 (six) hours as needed for wheezing or shortness of breath.  1 Inhaler  11  . ibuprofen (ADVIL,MOTRIN) 200 MG tablet Take 400 mg by mouth every 6 (six) hours as needed for pain.      . predniSONE (DELTASONE) 50 MG tablet Take prednisone 50 mg daily for 5 days and then stop.  5 tablet  0  . budesonide (PULMICORT) 0.5 MG/2ML nebulizer solution Take 2 mLs (0.5 mg total) by nebulization 2 (two) times daily.  2 mL  12   No current facility-administered medications on file prior to visit.   Family History  Problem Relation Age of Onset  . Hypertension Mother   . Diabetes Mother   . Hyperlipidemia Mother   . Heart disease Mother   . Stroke Mother   . Heart disease Maternal Grandmother   . Hypertension Maternal Grandmother   . Diabetes Paternal Grandmother    History   Social History  .  Marital Status: Single    Spouse Name: N/A    Number of Children: N/A  . Years of Education: N/A   Occupational History  . Not on file.   Social History Main Topics  . Smoking status: Former Games developer  . Smokeless tobacco: Never Used  . Alcohol Use: Yes     Comment: socially  . Drug Use: No  . Sexual Activity: Yes   Other Topics Concern  . Not on file   Social History Narrative  . No narrative on file    Review of Systems  Constitutional: Negative for fever, chills, diaphoresis, activity change, appetite change and fatigue.  HENT: Negative for ear pain, nosebleeds, congestion, facial swelling, rhinorrhea, neck pain, neck stiffness and ear discharge.   Eyes: Negative for pain, discharge, redness, itching and visual disturbance.  Respiratory: Negative for cough, choking, chest tightness, shortness of breath, wheezing and stridor.   Cardiovascular: Negative for chest pain, palpitations and leg swelling.  Gastrointestinal: Negative for abdominal distention.  Genitourinary: Negative for dysuria, urgency, frequency, hematuria, flank pain, decreased urine volume, difficulty urinating and dyspareunia.  Musculoskeletal: Negative for back pain, joint swelling, arthralgias and gait problem.  Neurological: Negative for dizziness, tremors, seizures, syncope, facial asymmetry, speech difficulty, weakness, light-headedness, numbness and headaches.  Hematological: Negative for adenopathy. Does not bruise/bleed easily.  Psychiatric/Behavioral: Negative for hallucinations, behavioral problems, confusion, dysphoric mood, decreased concentration and agitation.    Objective:  Filed Vitals:   04/07/13 1141  BP: 120/75  Pulse: 80  Temp:   Resp: 16    Physical Exam  Constitutional: Appears well-developed and well-nourished. No distress.  HENT: Normocephalic. External right and left ear normal. Oropharynx is clear and moist.  Eyes: Conjunctivae and EOM are normal. PERRLA, no scleral icterus.   Neck: Normal ROM. Neck supple. No JVD. No tracheal deviation. No thyromegaly.  CVS: RRR, S1/S2 +, no murmurs, no gallops, no carotid bruit.  Pulmonary: Effort and breath sounds normal, no stridor, rhonchi, wheezes, rales.  Abdominal: Soft. BS +,  no distension, tenderness, rebound or guarding.  Musculoskeletal: Normal range of motion. No edema and no tenderness.  Lymphadenopathy: No lymphadenopathy noted, cervical, inguinal. Neuro: Alert. Normal reflexes, muscle tone coordination. No cranial nerve deficit. Skin: Skin is warm and dry. No rash noted. Not diaphoretic. No erythema. No pallor.  Psychiatric: Normal mood and affect. Behavior, judgment, thought content normal.   Lab Results  Component Value Date   WBC 6.1 04/04/2013   HGB 12.6 04/04/2013   HCT 35.6* 04/04/2013   MCV 81.1 04/04/2013   PLT 220 04/04/2013   Lab Results  Component Value Date   CREATININE 0.64 04/04/2013   BUN 4* 04/04/2013   NA 135 04/04/2013   K 3.3* 04/04/2013   CL 106 04/04/2013   CO2 22 04/04/2013    No results found for this basename: HGBA1C   Lipid Panel  No results found for this basename: chol, trig, hdl, cholhdl, vldl, ldlcalc       Assessment and plan:   Patient Active Problem List   Diagnosis Date Noted  . Mild intermittent asthma 04/07/2013    Priority: High - Patient was recently hospitalized for acute asthma attack. She is on albuterol inhaler and prednisone by mouth. She did not use albuterol inhaler at all since that discharge. Patient will get a referral to pulmonary clinic for pulmonary function test

## 2013-04-07 NOTE — Patient Instructions (Addendum)

## 2013-04-07 NOTE — Progress Notes (Signed)
Pt is here to establish care. Recently was Dx with asthma last Monday.  Last April she was Dx with bronchitis. Other than that she says she very heathy.  D. Winfree C.M.A.

## 2013-04-18 ENCOUNTER — Ambulatory Visit: Payer: Self-pay

## 2013-05-01 ENCOUNTER — Encounter: Payer: Self-pay | Admitting: Pulmonary Disease

## 2013-05-01 ENCOUNTER — Ambulatory Visit (INDEPENDENT_AMBULATORY_CARE_PROVIDER_SITE_OTHER): Payer: No Typology Code available for payment source | Admitting: Pulmonary Disease

## 2013-05-01 VITALS — BP 102/66 | HR 88 | Temp 97.9°F | Ht 62.0 in | Wt 161.0 lb

## 2013-05-01 DIAGNOSIS — R21 Rash and other nonspecific skin eruption: Secondary | ICD-10-CM | POA: Insufficient documentation

## 2013-05-01 DIAGNOSIS — J45909 Unspecified asthma, uncomplicated: Secondary | ICD-10-CM

## 2013-05-01 DIAGNOSIS — J309 Allergic rhinitis, unspecified: Secondary | ICD-10-CM

## 2013-05-01 MED ORDER — BECLOMETHASONE DIPROPIONATE 80 MCG/ACT IN AERS: 1.0000 | INHALATION_SPRAY | Freq: Two times a day (BID) | RESPIRATORY_TRACT | Status: DC

## 2013-05-01 MED ORDER — AEROCHAMBER MINI CHAMBER DEVI: 1.0000 | Freq: Two times a day (BID) | Status: DC

## 2013-05-01 NOTE — Assessment & Plan Note (Signed)
This looks allergic. The lesion under her neck particularly does look like eczema.  Plan:  -hydrocortisone as needed

## 2013-05-01 NOTE — Assessment & Plan Note (Signed)
Ms. Norkus history sounds consistent with asthma in adulthood.  It is not clear to me what is causing this but by history heat and cigarette smoke exposure (second hand) is exacerbating it.  She should have asthma testing after she gets health insurance so that we can try to get to the bottom of this. She also needs to have simple spirometry after she gets health insurance.  She also has allergic rhinitis which is contributing to the asthma. Is not clear to me that there is an allergen at work which is contributing to her symptoms, this is where allergy testing would be very helpful.  Plan: -Qvar samples provided today to be taken 1 puff twice a day of 80 mcg dosing -Nasonex samples given -Over-the-counter Zyrtec -Followup with me in 2-3 months with simple spirometry and then we will refer to allergy

## 2013-05-01 NOTE — Assessment & Plan Note (Signed)
Nasonex samples given, recommended Nasacort over-the-counter Zyrtec over-the-counter

## 2013-05-01 NOTE — Progress Notes (Signed)
Subjective:    Patient ID: Kristin Mason, female    DOB: 12/25/1970, 42 y.o.   MRN: [id number]  HPI  This is a very pleasant 42 year old female who comes to our clinic today to establish care for presumed asthma. She had a normal childhood without respiratory illnesses and has never smoked. Her brother had severe asthma as a child growing up in her house. She has had allergic rhinitis over the years in the setting of heavy pollen exposure and the early fall.  Approximate 6 months ago she was diagnosed with bronchitis and was given an inhaler. In August of 2014 she had increasing wheezing, shortness of breath and cough and was admitted to [facility] for 24 hours for steroids and breathing treatments. She was discharged with a prednisone taper and Pulmicort. Unfortunately because of insurance reasons she was unable to get a nebulizer machine and was unable to pick up the Pulmicort. She is just been using albuterol as needed for shortness of breath since then.  She states that she gets short of breath with exertion and this is associated with wheezing and chest tightness. Climbing a flight of stairs makes her worse. She takes the bus to work and has to walk across the campus at [facility] and when she makes it to work she is fairly winded with wheezing. She will stop and use her albuterol inhaler and this provides relief of her symptoms. Her work environment is quite hot as she works in a [employer], mining as a Production designer, theatre/television/film for a [employer].  In this environment she is surrounded by pressure cooker and deep friers which used a peanut oil and canola oil.  Since she left the hospital she completed a course of prednisone. This provided immediate relief of the shortness of breath and wheezing. However the last several weeks for wheezing and shortness of breath have increased. She has a cough productive of clear sputum. She feels itchy eyes nose and a scratchy throat. She is only using albuterol that  was given to her hospital discharge.  She's had no sick contacts lately. In 02/07/2013 she had to move in with her father because he has a brain tumor he needs assistance with his health needs. Both he and his roommate smoke and this caused increasing shortness of breath and wheezing for her. They have since stopped smoking in the house and she says this is helped. The house is an older building but does not have a basement. She is unaware of any mold or mildew but there is significant dust in the house.   Past Medical History  Diagnosis Date  . UTI (urinary tract infection)   . Eczema   . Abnormal Pap smear 98    cryo therapy  . Asthma      Family History  Problem Relation Age of Onset  . Hypertension Mother   . Diabetes Mother   . Hyperlipidemia Mother   . Heart disease Mother   . Stroke Mother   . Heart disease Maternal Grandmother   . Hypertension Maternal Grandmother   . Diabetes Paternal Grandmother      History   Social History  . Marital Status: Single    Spouse Name: N/A    Number of Children: N/A  . Years of Education: N/A   Occupational History  . Not on file.   Social History Main Topics  . Smoking status: Never Smoker   . Smokeless tobacco: Never Used  . Alcohol Use: Yes  Comment: socially  . Drug Use: No  . Sexual Activity: Yes   Other Topics Concern  . Not on file   Social History Narrative  . No narrative on file     Allergies  Allergen Reactions  . Penicillins Anaphylaxis, Shortness Of Breath and Swelling  . Flagyl [Metronidazole] Swelling    Made feet blue and numb     Outpatient Prescriptions Prior to Visit  Medication Sig Dispense Refill  . albuterol (PROVENTIL HFA;VENTOLIN HFA) 108 (90 BASE) MCG/ACT inhaler Inhale 2 puffs into the lungs every 6 (six) hours as needed for wheezing or shortness of breath.  1 Inhaler  11  . ibuprofen (ADVIL,MOTRIN) 200 MG tablet Take 400 mg by mouth every 6 (six) hours as needed for pain.      .  budesonide (PULMICORT) 0.5 MG/2ML nebulizer solution Take 2 mLs (0.5 mg total) by nebulization 2 (two) times daily.  2 mL  12  . predniSONE (DELTASONE) 50 MG tablet Take prednisone 50 mg daily for 5 days and then stop.  5 tablet  0   No facility-administered medications prior to visit.      Review of Systems  Constitutional: Positive for appetite change. Negative for fever, chills, diaphoresis, activity change, fatigue and unexpected weight change.  HENT: Positive for ear pain, sore throat, sneezing and dental problem. Negative for hearing loss, nosebleeds, congestion, facial swelling, rhinorrhea, mouth sores, trouble swallowing, neck pain, neck stiffness, voice change, postnasal drip, sinus pressure, tinnitus and ear discharge.   Eyes: Negative for photophobia, discharge, itching and visual disturbance.  Respiratory: Positive for cough, shortness of breath and wheezing. Negative for apnea, choking, chest tightness and stridor.   Cardiovascular: Positive for chest pain and palpitations. Negative for leg swelling.  Gastrointestinal: Negative for nausea, vomiting, abdominal pain, constipation, blood in stool and abdominal distention.  Genitourinary: Negative for dysuria, urgency, frequency, hematuria, flank pain, decreased urine volume and difficulty urinating.  Musculoskeletal: Negative for myalgias, back pain, joint swelling, arthralgias and gait problem.  Skin: Positive for rash. Negative for color change and pallor.  Neurological: Positive for headaches. Negative for dizziness, tremors, seizures, syncope, speech difficulty, weakness, light-headedness and numbness.  Hematological: Negative for adenopathy. Does not bruise/bleed easily.  Psychiatric/Behavioral: Negative for confusion, sleep disturbance and agitation. The patient is not nervous/anxious.        Objective:   Physical Exam  Filed Vitals:   05/01/13 1021  BP: 102/66  Pulse: 88  Temp: 97.9 F (36.6 C)  TempSrc: Oral   Height: 5\' 2"  (1.575 m)  Weight: 161 lb (73.029 kg)  SpO2: 98%   Gen: well appearing, no acute distress HEENT: NCAT, PERRL, EOMi, OP clear, neck supple without masses; nasal mucosa swollen, red PULM: mild insp/exp wheezing mid lung zones, normal percussoin CV: RRR, no mgr, no JVD AB: BS+, soft, nontender, no hsm Ext: warm, no edema, no clubbing, no cyanosis Derm: excoriated rash under neck, macular/papular rash chest Neuro: A&Ox4, CN II-XII intact, strength 5/5 in all 4 extremities       Assessment & Plan:   Unspecified asthma(493.90) Ms. Priest's history sounds consistent with asthma in adulthood.  It is not clear to me what is causing this but by history heat and cigarette smoke exposure (second hand) is exacerbating it.  She should have asthma testing after she gets health insurance so that we can try to get to the bottom of this. She also needs to have simple spirometry after she gets health insurance.  She also has allergic  rhinitis which is contributing to the asthma. Is not clear to me that there is an allergen at work which is contributing to her symptoms, this is where allergy testing would be very helpful.  Plan: -Qvar samples provided today to be taken 1 puff twice a day of 80 mcg dosing -Nasonex samples given -Over-the-counter Zyrtec -Followup with me in 2-3 months with simple spirometry and then we will refer to allergy  Rash and nonspecific skin eruption This looks allergic. The lesion under her neck particularly does look like eczema.  Plan:  -hydrocortisone as needed  Allergic rhinitis Nasonex samples given, recommended Nasacort over-the-counter Zyrtec over-the-counter   Updated Medication List Outpatient Encounter Prescriptions as of 05/01/2013  Medication Sig Dispense Refill  . albuterol (PROVENTIL HFA;VENTOLIN HFA) 108 (90 BASE) MCG/ACT inhaler Inhale 2 puffs into the lungs every 6 (six) hours as needed for wheezing or shortness of breath.  1 Inhaler   11  . ibuprofen (ADVIL,MOTRIN) 200 MG tablet Take 400 mg by mouth every 6 (six) hours as needed for pain.      . beclomethasone (QVAR) 80 MCG/ACT inhaler Inhale 1 puff into the lungs 2 (two) times daily.  1 Inhaler  6  . [DISCONTINUED] budesonide (PULMICORT) 0.5 MG/2ML nebulizer solution Take 2 mLs (0.5 mg total) by nebulization 2 (two) times daily.  2 mL  12  . [DISCONTINUED] predniSONE (DELTASONE) 50 MG tablet Take prednisone 50 mg daily for 5 days and then stop.  5 tablet  0   No facility-administered encounter medications on file as of 05/01/2013.

## 2013-05-01 NOTE — Patient Instructions (Signed)
Take the Qvar inhaler one puff twice a day with the spacer, rinse your mouth after using it Use the Nasonex two sprays each nostril once a day; after the sample runs out you can use Nasacort over the counter  We wil see you back in 2-3 months with a spirometry test  Use hydrocortisone cream for the rash

## 2013-05-01 NOTE — Addendum Note (Signed)
Addended by: Caryl Ada on: 05/01/2013 11:11 AM   Modules accepted: Orders

## 2013-05-03 ENCOUNTER — Ambulatory Visit: Payer: No Typology Code available for payment source | Admitting: Internal Medicine

## 2013-06-15 ENCOUNTER — Other Ambulatory Visit: Payer: Self-pay

## 2013-08-08 ENCOUNTER — Emergency Department (HOSPITAL_COMMUNITY)
Admission: EM | Admit: 2013-08-08 | Discharge: 2013-08-08 | Disposition: A | Discharge status: Home / Self Care | Payer: No Typology Code available for payment source | Attending: Emergency Medicine | Admitting: Emergency Medicine

## 2013-08-08 ENCOUNTER — Encounter (HOSPITAL_COMMUNITY): Payer: Self-pay | Admitting: Emergency Medicine

## 2013-08-08 DIAGNOSIS — N760 Acute vaginitis: Secondary | ICD-10-CM | POA: Insufficient documentation

## 2013-08-08 DIAGNOSIS — N939 Abnormal uterine and vaginal bleeding, unspecified: Secondary | ICD-10-CM

## 2013-08-08 DIAGNOSIS — B9689 Other specified bacterial agents as the cause of diseases classified elsewhere: Secondary | ICD-10-CM | POA: Insufficient documentation

## 2013-08-08 DIAGNOSIS — Z79899 Other long term (current) drug therapy: Secondary | ICD-10-CM | POA: Insufficient documentation

## 2013-08-08 DIAGNOSIS — A499 Bacterial infection, unspecified: Secondary | ICD-10-CM | POA: Insufficient documentation

## 2013-08-08 DIAGNOSIS — K644 Residual hemorrhoidal skin tags: Secondary | ICD-10-CM | POA: Insufficient documentation

## 2013-08-08 DIAGNOSIS — Z3202 Encounter for pregnancy test, result negative: Secondary | ICD-10-CM | POA: Insufficient documentation

## 2013-08-08 DIAGNOSIS — IMO0002 Reserved for concepts with insufficient information to code with codable children: Secondary | ICD-10-CM | POA: Insufficient documentation

## 2013-08-08 DIAGNOSIS — Z8619 Personal history of other infectious and parasitic diseases: Secondary | ICD-10-CM | POA: Insufficient documentation

## 2013-08-08 DIAGNOSIS — Z872 Personal history of diseases of the skin and subcutaneous tissue: Secondary | ICD-10-CM | POA: Insufficient documentation

## 2013-08-08 DIAGNOSIS — Z880 Allergy status to penicillin: Secondary | ICD-10-CM | POA: Insufficient documentation

## 2013-08-08 DIAGNOSIS — Z87440 Personal history of urinary (tract) infections: Secondary | ICD-10-CM | POA: Insufficient documentation

## 2013-08-08 DIAGNOSIS — K649 Unspecified hemorrhoids: Secondary | ICD-10-CM

## 2013-08-08 DIAGNOSIS — J45909 Unspecified asthma, uncomplicated: Secondary | ICD-10-CM | POA: Insufficient documentation

## 2013-08-08 LAB — URINE MICROSCOPIC-ADD ON

## 2013-08-08 LAB — WET PREP, GENITAL: Yeast Wet Prep HPF POC: NONE SEEN

## 2013-08-08 LAB — URINALYSIS, ROUTINE W REFLEX MICROSCOPIC
Glucose, UA: NEGATIVE mg/dL
Ketones, ur: NEGATIVE mg/dL
pH: 6 (ref 5.0–8.0)

## 2013-08-08 LAB — PREGNANCY, URINE: Preg Test, Ur: NEGATIVE

## 2013-08-08 MED ORDER — CLINDAMYCIN HCL 300 MG PO CAPS
300.0000 mg | ORAL_CAPSULE | Freq: Once | ORAL | Status: AC
  Administered 2013-08-08: 300 mg via ORAL
  Filled 2013-08-08: qty 1

## 2013-08-08 MED ORDER — CLINDAMYCIN HCL 150 MG PO CAPS: 300.0000 mg | ORAL_CAPSULE | Freq: Two times a day (BID) | ORAL | Status: AC

## 2013-08-08 MED ORDER — STARCH 51 % RE SUPP: 1.0000 | RECTAL | Status: DC | PRN

## 2013-08-08 NOTE — ED Notes (Signed)
States one week, thought she had hemorrhoids, bright red blood in stool, states now with brb from vagina, states period was 12/23, denies abdominal pain, c/o burning with urination

## 2013-08-08 NOTE — ED Provider Notes (Signed)
CSN: [id number]     Arrival date & time 08/08/13  1226 History   First MD Initiated Contact with Patient 08/08/13 1615     Chief Complaint  Patient presents with  . Hemorrhoids  . Vaginal Bleeding  . Abscess   (Consider location/radiation/quality/duration/timing/severity/associated sxs/prior Treatment) HPI Patient presents with concerns rectal pain, rectal bleeding, vaginal bleeding. Patient's rectal discomfort began approximately one week ago.  Since that time she said pain with defecation, bleeding with defecation.  Pain is improved when not having a bowel movement. There is occasional mucus-like discharge as well. The patient's vaginal bleeding in the past few days. Last menstrual period was 2 weeks ago. Patient notes that this is an atypical time interval between menstrual cycles. She notes that there is mild associated dysuria. No significant lower abdominal pain, no fever, no lightheadedness, no syncope, no chills. Patient states that she was diagnosed with trichomonas earlier this year, did not take antibiotics.  Past Medical History  Diagnosis Date  . UTI (urinary tract infection)   . Eczema   . Abnormal Pap smear 98    cryo therapy  . Asthma    Past Surgical History  Procedure Laterality Date  . Cesarean section    . Therapeutic abortion     Family History  Problem Relation Age of Onset  . Hypertension Mother   . Diabetes Mother   . Hyperlipidemia Mother   . Heart disease Mother   . Stroke Mother   . Heart disease Maternal Grandmother   . Hypertension Maternal Grandmother   . Diabetes Paternal Grandmother    History  Substance Use Topics  . Smoking status: Never Smoker   . Smokeless tobacco: Never Used  . Alcohol Use: Yes     Comment: socially   OB History   Grav Para Term Preterm Abortions TAB SAB Ect Mult Living   4 3 3  0 1 1 0 0 0 3     Review of Systems  Constitutional:       Per HPI, otherwise negative  HENT:       Per HPI, otherwise  negative  Respiratory:       Per HPI, otherwise negative  Cardiovascular:       Per HPI, otherwise negative  Gastrointestinal: Negative for vomiting.  Endocrine:       Negative aside from HPI  Genitourinary:       Neg aside from HPI   Musculoskeletal:       Per HPI, otherwise negative  Skin: Negative.   Neurological: Negative for syncope.    Allergies  Penicillins and Flagyl  Home Medications   Current Outpatient Rx  Name  Route  Sig  Dispense  Refill  . albuterol (PROVENTIL HFA;VENTOLIN HFA) 108 (90 BASE) MCG/ACT inhaler   Inhalation   Inhale 2 puffs into the lungs every 6 (six) hours as needed for wheezing or shortness of breath.   1 Inhaler   11   . beclomethasone (QVAR) 80 MCG/ACT inhaler   Inhalation   Inhale 1 puff into the lungs 2 (two) times daily.   1 Inhaler   6   . ibuprofen (ADVIL,MOTRIN) 200 MG tablet   Oral   Take 400 mg by mouth every 6 (six) hours as needed for pain.          BP 148/90  Pulse 107  Temp(Src) 98.6 F (37 C) (Oral)  SpO2 98% Physical Exam  Nursing note and vitals reviewed. Constitutional: She is oriented to person, place, and  time. She appears well-developed and well-nourished. No distress.  HENT:  Head: Normocephalic and atraumatic.  Eyes: Conjunctivae and EOM are normal.  Cardiovascular: Normal rate and regular rhythm.   Pulmonary/Chest: Effort normal and breath sounds normal. No stridor. No respiratory distress.  Abdominal: She exhibits no distension. There is no tenderness. There is no rebound and no guarding.  Genitourinary:    Musculoskeletal: She exhibits no edema.  Neurological: She is alert and oriented to person, place, and time. No cranial nerve deficit.  Skin: Skin is warm and dry.  Psychiatric: She has a normal mood and affect.    ED Course  Pelvic exam Date/Time: 08/08/2013 4:47 PM Performed by: Gerhard Munch Authorized by: Gerhard Munch Consent: Verbal consent obtained. The procedure was performed  in an emergent situation. Risks and benefits: risks, benefits and alternatives were discussed Consent given by: patient Patient understanding: patient states understanding of the procedure being performed Patient consent: the patient's understanding of the procedure matches consent given Procedure consent: procedure consent matches procedure scheduled Relevant documents: relevant documents present and verified Test results: test results available and properly labeled Imaging studies: imaging studies available Required items: required blood products, implants, devices, and special equipment available Patient identity confirmed: verbally with patient Preparation: Patient was prepped and draped in the usual sterile fashion. Local anesthesia used: no Patient sedated: no Patient tolerance: Patient tolerated the procedure well with no immediate complications. Comments: Patient mild discomfort with speculum exam, at the cervical os there is mild bleeding, mild white discharge.  No adnexal tenderness, no cervical motion tenderness.    (including critical care time) Labs Review Labs Reviewed  URINALYSIS, ROUTINE W REFLEX MICROSCOPIC - Abnormal; Notable for the following:    APPearance CLOUDY (*)    Hgb urine dipstick LARGE (*)    Leukocytes, UA MODERATE (*)    All other components within normal limits  URINE CULTURE  GC/CHLAMYDIA PROBE AMP  WET PREP, GENITAL  PREGNANCY, URINE  URINE MICROSCOPIC-ADD ON  RPR  POCT PREGNANCY, URINE   Imaging Review No results found.  EKG Interpretation   None      After initial evaluation in the patient's chart.   6:03 PM Patient appears comfortable.  She is aware of results.  She is also aware of pending tests.  2 [facility]   MDM  No diagnosis found. Patient presents with concerns of vaginal bleeding and rectal pain.  On exam she is awake, alert, and in no distress.  Patient's Coumadin if stable, with  unremarkable labs.  Labs to demonstrate extra vaginosis.  Patient was initiated on therapy for this as well as for her hemorrhoids.  Patient is appropriate for discharge with additional evaluation to be performed as an outpatient    Gerhard Munch, MD 08/08/13 [phone]

## 2013-08-08 NOTE — Discharge Instructions (Signed)
As discussed, your evaluation has demonstrated the presence of an infection.  It is important to take all medication as directed, followup with your primary care physician and our [facility] colleagues.  Return here for concerning changes in your condition.  Again, recalls that several studies are pending.  You will be made aware if these results are abnormal.

## 2013-08-08 NOTE — Progress Notes (Signed)
[facility] CL spoke with patient about her [facility] [employer]. Provided her with another [employer], highlighting [facility] because patient current card is about to expire. Also, provided pt with [employer] information.

## 2013-08-09 LAB — URINE CULTURE

## 2013-08-09 LAB — GC/CHLAMYDIA PROBE AMP
CT Probe RNA: NEGATIVE
GC Probe RNA: POSITIVE — AB

## 2013-08-10 ENCOUNTER — Telehealth (HOSPITAL_COMMUNITY): Payer: Self-pay | Admitting: Emergency Medicine

## 2013-08-10 NOTE — ED Notes (Signed)
Patient has +Gonorrhea. °

## 2013-08-10 NOTE — ED Notes (Signed)
+  Gonorrhea. Chart sent to EDP office for review. [facility] attached. °

## 2013-08-11 NOTE — ED Notes (Addendum)
Chart returned from [place] office .With orders for patient to receive Rocephin 250 mg IM once per Domenic Moras.Patient notified to return to ED

## 2013-08-15 ENCOUNTER — Emergency Department (HOSPITAL_COMMUNITY)
Admission: EM | Admit: 2013-08-15 | Discharge: 2013-08-15 | Disposition: A | Discharge status: Home / Self Care | Payer: Medicaid Other | Attending: Emergency Medicine | Admitting: Emergency Medicine

## 2013-08-15 ENCOUNTER — Encounter (HOSPITAL_COMMUNITY): Payer: Self-pay | Admitting: Emergency Medicine

## 2013-08-15 DIAGNOSIS — R300 Dysuria: Secondary | ICD-10-CM | POA: Insufficient documentation

## 2013-08-15 DIAGNOSIS — Z872 Personal history of diseases of the skin and subcutaneous tissue: Secondary | ICD-10-CM | POA: Insufficient documentation

## 2013-08-15 DIAGNOSIS — A549 Gonococcal infection, unspecified: Secondary | ICD-10-CM

## 2013-08-15 DIAGNOSIS — IMO0002 Reserved for concepts with insufficient information to code with codable children: Secondary | ICD-10-CM | POA: Insufficient documentation

## 2013-08-15 DIAGNOSIS — Z792 Long term (current) use of antibiotics: Secondary | ICD-10-CM | POA: Insufficient documentation

## 2013-08-15 DIAGNOSIS — J45909 Unspecified asthma, uncomplicated: Secondary | ICD-10-CM | POA: Insufficient documentation

## 2013-08-15 DIAGNOSIS — Z87440 Personal history of urinary (tract) infections: Secondary | ICD-10-CM | POA: Insufficient documentation

## 2013-08-15 DIAGNOSIS — A5400 Gonococcal infection of lower genitourinary tract, unspecified: Secondary | ICD-10-CM | POA: Insufficient documentation

## 2013-08-15 DIAGNOSIS — K625 Hemorrhage of anus and rectum: Secondary | ICD-10-CM | POA: Insufficient documentation

## 2013-08-15 DIAGNOSIS — Z880 Allergy status to penicillin: Secondary | ICD-10-CM | POA: Insufficient documentation

## 2013-08-15 DIAGNOSIS — Z79899 Other long term (current) drug therapy: Secondary | ICD-10-CM | POA: Insufficient documentation

## 2013-08-15 HISTORY — DX: Unspecified sexually transmitted disease: A64

## 2013-08-15 MED ORDER — DOXYCYCLINE HYCLATE 100 MG PO CAPS: 100.0000 mg | ORAL_CAPSULE | Freq: Two times a day (BID) | ORAL | Status: DC

## 2013-08-15 MED ORDER — CEFTRIAXONE SODIUM 250 MG IJ SOLR: 250.0000 mg | Freq: Once | INTRAMUSCULAR | Status: DC

## 2013-08-15 NOTE — ED Notes (Signed)
Pt reports that she was called to return to ED to receive antibiotic for STD

## 2013-08-15 NOTE — Discharge Instructions (Signed)
Follow up with PCP or ED  in 1-2 weeks for retesting of Gonorrhea infection. Want to ensure eradication of infection.

## 2013-08-15 NOTE — Progress Notes (Signed)
[facility] CL spoke with patient about renewing her [place] with [employer] [facility]. Patient stated that she still had the application from last ED visit. Provided pt with a list of self-pay pcps and information on [employer].

## 2013-08-15 NOTE — ED Provider Notes (Signed)
CSN: [id number]     Arrival date & time 08/15/13  1536 History  This chart was scribed for Sherrie George,  PA working with Neta Ehlers, MD by Roxan Diesel, ED Scribe. This patient was seen in room WTR7/WTR7 and the patient's care was started at 4:45 PM.   Chief Complaint  Patient presents with  . Vaginal Discharge    follow up visit for STD    The history is provided by the patient. No language interpreter was used.    HPI Comments: Kristin Mason is a 43 y.o. female who presents to the Emergency Department for a f/u visit for recently-diagnosed gonorrhea.  Pt was seen in the ED on 12/30 for vaginal discharge with some vaginal and rectal bleeding and dysuria.  She was diagnosed with possible hemorrhoids and bacterial vaginosis and sent home on clindamycin.  However a gonorrhea culture was taken and resulted as positive after her discharge.  She was told to return to the ED for a Rocephin injection.  Pt states that her vaginal discharge, rectal and vaginal bleeding, and burning on urination have all been improving since she was placed on clindamycin.  However she is concerned that for the past month  she has noticed some foul-smelling material "that looks like snot, or maybe a worm," "wrapped around or inside" the end of her stool, with some occasional blood mixed in to the same area.  Patient denies diarrhea, admits to alternating hard/soft stools. She states this has also been improving since she was placed on clindamycin but is still occurring.    She denies abdominal pain, fevers or chills. Denies recent antibiotic use prior to being placed on Clindamycin.  She denies recent travel to endemic areas.     Past Medical History  Diagnosis Date  . UTI (urinary tract infection)   . Eczema   . Abnormal Pap smear 98    cryo therapy  . Asthma   . STD (female)     Past Surgical History  Procedure Laterality Date  . Cesarean section    . Therapeutic abortion      Family  History  Problem Relation Age of Onset  . Hypertension Mother   . Diabetes Mother   . Hyperlipidemia Mother   . Heart disease Mother   . Stroke Mother   . Heart disease Maternal Grandmother   . Hypertension Maternal Grandmother   . Diabetes Paternal Grandmother     History  Substance Use Topics  . Smoking status: Never Smoker   . Smokeless tobacco: Never Used  . Alcohol Use: Yes     Comment: socially    OB History   Grav Para Term Preterm Abortions TAB SAB Ect Mult Living   4 3 3  0 1 1 0 0 0 3      Review of Systems  Gastrointestinal: Negative for nausea and vomiting.  Skin: Negative for rash.  All other systems reviewed and are negative.     Allergies  Penicillins and Flagyl  Home Medications   Current Outpatient Rx  Name  Route  Sig  Dispense  Refill  . albuterol (PROVENTIL HFA;VENTOLIN HFA) 108 (90 BASE) MCG/ACT inhaler   Inhalation   Inhale 2 puffs into the lungs every 6 (six) hours as needed for wheezing or shortness of breath.   1 Inhaler   11   . beclomethasone (QVAR) 80 MCG/ACT inhaler   Inhalation   Inhale 1 puff into the lungs 2 (two) times daily.  1 Inhaler   6   . ibuprofen (ADVIL,MOTRIN) 200 MG tablet   Oral   Take 400 mg by mouth every 6 (six) hours as needed for pain.         Marland Kitchen doxycycline (VIBRAMYCIN) 100 MG capsule   Oral   Take 1 capsule (100 mg total) by mouth 2 (two) times daily. One po bid x 7 days   14 capsule   0    BP 109/77  Pulse 99  Temp(Src) 98.5 F (36.9 C) (Oral)  Resp 16  LMP 07/25/2013  Physical Exam  Nursing note and vitals reviewed. Constitutional: She is oriented to person, place, and time. She appears well-developed and well-nourished. No distress.  HENT:  Head: Normocephalic and atraumatic.  Eyes: EOM are normal.  Neck: Normal range of motion. Neck supple.  Cardiovascular: Normal rate, regular rhythm and normal heart sounds.   Pulmonary/Chest: Effort normal and breath sounds normal.  Abdominal:  Soft. Bowel sounds are normal. There is no tenderness.  Musculoskeletal: Normal range of motion.  Neurological: She is alert and oriented to person, place, and time.  Skin: Skin is warm and dry.  Psychiatric: She has a normal mood and affect. Her behavior is normal.    ED Course  Procedures (including critical care time)   COORDINATION OF CARE: 4:57 PM-Discussed treatment plan which includes doxycycline with pt at bedside and pt agreed to plan.    Labs Review Labs Reviewed - No data to display  Imaging Review No results found.  EKG Interpretation   None       MDM   1. Gonorrhea    Patient afebrile.   Patient admits to anaphylaxis reaction to Penicillin. Plan to treat patient's infection with Doxycycline to avoid possible cross sensitivity reaction with Cephalosporin.   Advised follow up with PCP or ED  in 1-2 weeks for retesting of Gonorrhea infection. Want to ensure eradication of infection.   I personally performed the services described in this documentation, which was scribed in my presence. The recorded information has been reviewed and is accurate.  Meds given in ED:  Medications - No data to display  Discharge Medication List as of 08/15/2013  5:20 PM    START taking these medications   Details  doxycycline (VIBRAMYCIN) 100 MG capsule Take 1 capsule (100 mg total) by mouth 2 (two) times daily. One po bid x 7 days, Starting 08/15/2013, Until Discontinued, Print         Dossie Arbour [place], [place] 08/16/13 [phone]

## 2013-08-15 NOTE — ED Notes (Signed)
[facility] faxed with note that patient received abx for doxycyline

## 2013-08-16 NOTE — ED Provider Notes (Signed)
Medical screening examination/treatment/procedure(s) were performed by non-physician practitioner and as supervising physician I was immediately available for consultation/collaboration.  EKG Interpretation   None         Neta Ehlers, MD 08/16/13 1106

## 2013-08-22 ENCOUNTER — Telehealth: Payer: Self-pay | Admitting: Pulmonary Disease

## 2013-08-22 NOTE — Telephone Encounter (Signed)
called patient to make apt x3. No return call back. Sent letter 1/13. °

## 2013-09-05 ENCOUNTER — Telehealth: Payer: Self-pay | Admitting: Internal Medicine

## 2013-09-05 NOTE — Telephone Encounter (Signed)
Pt came in today looking for Dr. Doyle Askew, pt is in need of her signature for Medical Leave Of Absence Form; please contact the pt to advise her on what to do next; pt can be reached at [phone] or [phone];

## 2014-03-19 ENCOUNTER — Emergency Department (HOSPITAL_COMMUNITY): Payer: Medicaid Other

## 2014-03-19 ENCOUNTER — Encounter (HOSPITAL_COMMUNITY): Payer: Self-pay | Admitting: Emergency Medicine

## 2014-03-19 ENCOUNTER — Emergency Department (HOSPITAL_COMMUNITY)
Admission: EM | Admit: 2014-03-19 | Discharge: 2014-03-19 | Disposition: A | Discharge status: Home / Self Care | Payer: Medicaid Other | Attending: Emergency Medicine | Admitting: Emergency Medicine

## 2014-03-19 DIAGNOSIS — R0789 Other chest pain: Secondary | ICD-10-CM | POA: Insufficient documentation

## 2014-03-19 DIAGNOSIS — Z87440 Personal history of urinary (tract) infections: Secondary | ICD-10-CM | POA: Insufficient documentation

## 2014-03-19 DIAGNOSIS — Z79899 Other long term (current) drug therapy: Secondary | ICD-10-CM | POA: Insufficient documentation

## 2014-03-19 DIAGNOSIS — IMO0002 Reserved for concepts with insufficient information to code with codable children: Secondary | ICD-10-CM | POA: Insufficient documentation

## 2014-03-19 DIAGNOSIS — Z872 Personal history of diseases of the skin and subcutaneous tissue: Secondary | ICD-10-CM | POA: Insufficient documentation

## 2014-03-19 DIAGNOSIS — J45901 Unspecified asthma with (acute) exacerbation: Secondary | ICD-10-CM | POA: Insufficient documentation

## 2014-03-19 DIAGNOSIS — Z880 Allergy status to penicillin: Secondary | ICD-10-CM | POA: Insufficient documentation

## 2014-03-19 MED ORDER — ALBUTEROL SULFATE (2.5 MG/3ML) 0.083% IN NEBU
5.0000 mg | INHALATION_SOLUTION | Freq: Once | RESPIRATORY_TRACT | Status: AC
  Administered 2014-03-19: 5 mg via RESPIRATORY_TRACT
  Filled 2014-03-19: qty 6

## 2014-03-19 MED ORDER — PREDNISONE 20 MG PO TABS: 60.0000 mg | ORAL_TABLET | Freq: Every day | ORAL | Status: DC

## 2014-03-19 MED ORDER — ALBUTEROL SULFATE HFA 108 (90 BASE) MCG/ACT IN AERS: 1.0000 | INHALATION_SPRAY | Freq: Four times a day (QID) | RESPIRATORY_TRACT | Status: DC | PRN

## 2014-03-19 MED ORDER — ALBUTEROL (5 MG/ML) CONTINUOUS INHALATION SOLN
10.0000 mg/h | INHALATION_SOLUTION | Freq: Once | RESPIRATORY_TRACT | Status: AC
  Administered 2014-03-19: 10 mg/h via RESPIRATORY_TRACT
  Filled 2014-03-19: qty 20

## 2014-03-19 MED ORDER — MAGNESIUM SULFATE 40 MG/ML IJ SOLN
2.0000 g | Freq: Once | INTRAMUSCULAR | Status: AC
  Administered 2014-03-19: 2 g via INTRAVENOUS
  Filled 2014-03-19: qty 50

## 2014-03-19 MED ORDER — MAGNESIUM SULFATE 50 % IJ SOLN
2.0000 g | Freq: Once | INTRAMUSCULAR | Status: DC
  Filled 2014-03-19: qty 4

## 2014-03-19 MED ORDER — IPRATROPIUM BROMIDE 0.02 % IN SOLN
0.5000 mg | Freq: Once | RESPIRATORY_TRACT | Status: AC
  Administered 2014-03-19: 0.5 mg via RESPIRATORY_TRACT
  Filled 2014-03-19: qty 2.5

## 2014-03-19 MED ORDER — MAGNESIUM SULFATE 50 % IJ SOLN: 2.0000 g | Freq: Once | INTRAMUSCULAR | Status: DC

## 2014-03-19 MED ORDER — ALBUTEROL SULFATE HFA 108 (90 BASE) MCG/ACT IN AERS
2.0000 | INHALATION_SPRAY | RESPIRATORY_TRACT | Status: DC | PRN
  Filled 2014-03-19: qty 6.7

## 2014-03-19 MED ORDER — PREDNISONE 20 MG PO TABS
60.0000 mg | ORAL_TABLET | Freq: Once | ORAL | Status: AC
  Administered 2014-03-19: 60 mg via ORAL
  Filled 2014-03-19: qty 3

## 2014-03-19 MED ORDER — IBUPROFEN 200 MG PO TABS
400.0000 mg | ORAL_TABLET | Freq: Once | ORAL | Status: AC
  Administered 2014-03-19: 400 mg via ORAL
  Filled 2014-03-19: qty 2

## 2014-03-19 NOTE — ED Notes (Signed)
Pt walked with pulse ox: Pt maintained O2 sat of 96%.

## 2014-03-19 NOTE — Discharge Instructions (Signed)

## 2014-03-19 NOTE — ED Notes (Signed)
Respiratory called to do continuous neb.

## 2014-03-19 NOTE — ED Notes (Signed)
Pt reports sob due to asthma exacerbation that became worse yesterday and trying home nebulizer without relief. Pt initial sats 90 on room air. Alert and oriented. Pt ambulatory.

## 2014-03-19 NOTE — ED Provider Notes (Signed)
Medical screening examination/treatment/procedure(s) were performed by non-physician practitioner and as supervising physician I was immediately available for consultation/collaboration.   EKG Interpretation None        Debby Freiberg, MD 03/20/14 1459

## 2014-03-19 NOTE — ED Notes (Signed)
While walking, pt O2 consistently stayed in the low 90s, 90-93%.

## 2014-03-19 NOTE — ED Provider Notes (Signed)
CSN: [id number]     Arrival date & time 03/19/14  1557 History   First MD Initiated Contact with Patient 03/19/14 1628     No chief complaint on file.    (Consider location/radiation/quality/duration/timing/severity/associated sxs/prior Treatment) HPI Comments: Patient with a history of Asthma presents today with a chief complaint of SOB, wheezing, productive cough, and chest tightness.  She reports that her symptoms have been present for the past 5 days and are gradually worsening.  She reports that she has used an Albuterol inhaler, which gives her mild temporary relief.  She reports associated nasal congestion.  She denies chest pain, fever, or chills.  She denies any prior hospitalizations or intubations for her Asthma.  She does not currently smoke.  She denies any LE edema.  No prolonged travel or surgeries in the past 4 weeks.  She is not on any estrogen containing medications.  No history of DVT or PE.  The history is provided by the patient.    Past Medical History  Diagnosis Date  . UTI (urinary tract infection)   . Eczema   . Abnormal Pap smear 98    cryo therapy  . Asthma   . STD (female)    Past Surgical History  Procedure Laterality Date  . Cesarean section    . Therapeutic abortion     Family History  Problem Relation Age of Onset  . Hypertension Mother   . Diabetes Mother   . Hyperlipidemia Mother   . Heart disease Mother   . Stroke Mother   . Heart disease Maternal Grandmother   . Hypertension Maternal Grandmother   . Diabetes Paternal Grandmother    History  Substance Use Topics  . Smoking status: Never Smoker   . Smokeless tobacco: Never Used  . Alcohol Use: Yes     Comment: socially   OB History   Grav Para Term Preterm Abortions TAB SAB Ect Mult Living   4 3 3  0 1 1 0 0 0 3     Review of Systems  Constitutional: Negative for fever and chills.  Respiratory: Positive for cough, chest tightness, shortness of breath and wheezing.   Cardiovascular:  Negative for chest pain and leg swelling.  All other systems reviewed and are negative.     Allergies  Penicillins and Flagyl  Home Medications   Prior to Admission medications   Medication Sig Start Date End Date Taking? Authorizing Provider  albuterol (PROVENTIL HFA;VENTOLIN HFA) 108 (90 BASE) MCG/ACT inhaler Inhale 2 puffs into the lungs every 6 (six) hours as needed for wheezing or shortness of breath. 04/04/13  Yes Robbie Lis, MD  beclomethasone (QVAR) 80 MCG/ACT inhaler Inhale 1 puff into the lungs 2 (two) times daily. 05/01/13  Yes Juanito Doom, MD  ibuprofen (ADVIL,MOTRIN) 200 MG tablet Take 400 mg by mouth every 6 (six) hours as needed for pain.   Yes Historical Provider, MD   BP 121/87  Pulse 108  Temp(Src) 98 F (36.7 C) (Oral)  Resp 24  SpO2 98%  LMP 03/18/2014 Physical Exam  Nursing note and vitals reviewed. Constitutional: She appears well-developed and well-nourished.  HENT:  Head: Normocephalic and atraumatic.  Right Ear: Tympanic membrane and ear canal normal.  Left Ear: Tympanic membrane and ear canal normal.  Mouth/Throat: Oropharynx is clear and moist.  Neck: Normal range of motion. Neck supple.  Cardiovascular: Normal rate, regular rhythm and normal heart sounds.   Pulmonary/Chest: Effort normal and breath sounds normal.  Patient speaking in  complete sentences Diffuse late inspiratory and expiratory wheezing. Rhonchi present at the bases  Musculoskeletal: Normal range of motion.  Neurological: She is alert.  Skin: Skin is warm and dry.  Psychiatric: She has a normal mood and affect.    ED Course  Procedures (including critical care time) Labs Review Labs Reviewed - No data to display  Imaging Review Dg Chest 2 View (if Patient Has Fever And/or Copd)  03/19/2014   CLINICAL DATA:  Shortness of breath with cough and congestion and history of asthma  EXAM: CHEST  2 VIEW  COMPARISON:  PA and lateral chest x-ray of April 03, 2013.  FINDINGS:  The lungs are mildly hyperinflated. There is no focal infiltrate. The heart and mediastinal structures are normal. There is no pleural effusion or pneumothorax. The bony thorax is unremarkable.  IMPRESSION: There is mild hyperinflation which may be related known reactive airway disease. There is no evidence of pneumonia.   Electronically Signed   By: David  Martinique   On: 03/19/2014 16:53     EKG Interpretation None     6:45 PM Reassessed patient.  She reports mild improvement in SOB.  Patient continues to have diffuse expiratory wheezing.  Will order another breathing treatment and reassess.  8:30 PM Reassessed patient.  She reports improvement in SOB.  Patient continues to have diffuse expiratory wheezing.  Will check pulse ox while ambulating.  9:00 PM Pulse ox 90-93 with ambulating.  Will order Magnesium and a continuous neb and reassess.   10:59 PM Reassessed patient.  She reports significant improvement in SOB after breathing treatment.  Lungs with very mild expiratory wheezing at the bases of both lungs.  Patient ambulated and maintained pulse ox of 96 on RA.  Patient reports that she feels like she is ready to be discharged. MDM   Final diagnoses:  None   Patient ambulated in ED with O2 saturations maintained >96, no current signs of respiratory distress. Lung exam improved after nebulizer treatment. Prednisone given in the ED and pt will bd dc with 5 day burst. Pt states they are breathing at baseline. Pt has been instructed to continue using prescribed medications and to speak with PCP about today's exacerbation.  Return precautions given.    Hyman Bible, PA-C 03/21/14 0002

## 2014-03-24 NOTE — ED Provider Notes (Signed)
Medical screening examination/treatment/procedure(s) were performed by non-physician practitioner and as supervising physician I was immediately available for consultation/collaboration.   EKG Interpretation None        Debby Freiberg, MD 03/24/14 [phone]

## 2014-05-28 ENCOUNTER — Encounter (HOSPITAL_COMMUNITY): Payer: Self-pay | Admitting: Emergency Medicine

## 2014-05-28 ENCOUNTER — Emergency Department (HOSPITAL_COMMUNITY): Payer: Medicaid Other

## 2014-05-28 ENCOUNTER — Emergency Department (HOSPITAL_COMMUNITY)
Admission: EM | Admit: 2014-05-28 | Discharge: 2014-05-28 | Disposition: A | Discharge status: Home / Self Care | Payer: Self-pay | Attending: Emergency Medicine | Admitting: Emergency Medicine

## 2014-05-28 DIAGNOSIS — Z87440 Personal history of urinary (tract) infections: Secondary | ICD-10-CM | POA: Insufficient documentation

## 2014-05-28 DIAGNOSIS — R6883 Chills (without fever): Secondary | ICD-10-CM | POA: Insufficient documentation

## 2014-05-28 DIAGNOSIS — J45909 Unspecified asthma, uncomplicated: Secondary | ICD-10-CM | POA: Insufficient documentation

## 2014-05-28 DIAGNOSIS — Z79899 Other long term (current) drug therapy: Secondary | ICD-10-CM | POA: Insufficient documentation

## 2014-05-28 DIAGNOSIS — Z872 Personal history of diseases of the skin and subcutaneous tissue: Secondary | ICD-10-CM | POA: Insufficient documentation

## 2014-05-28 DIAGNOSIS — J029 Acute pharyngitis, unspecified: Secondary | ICD-10-CM | POA: Insufficient documentation

## 2014-05-28 DIAGNOSIS — R079 Chest pain, unspecified: Secondary | ICD-10-CM | POA: Insufficient documentation

## 2014-05-28 DIAGNOSIS — J209 Acute bronchitis, unspecified: Secondary | ICD-10-CM | POA: Insufficient documentation

## 2014-05-28 DIAGNOSIS — Z880 Allergy status to penicillin: Secondary | ICD-10-CM | POA: Insufficient documentation

## 2014-05-28 DIAGNOSIS — R0602 Shortness of breath: Secondary | ICD-10-CM | POA: Insufficient documentation

## 2014-05-28 DIAGNOSIS — Z8619 Personal history of other infectious and parasitic diseases: Secondary | ICD-10-CM | POA: Insufficient documentation

## 2014-05-28 DIAGNOSIS — Z7952 Long term (current) use of systemic steroids: Secondary | ICD-10-CM | POA: Insufficient documentation

## 2014-05-28 MED ORDER — PREDNISONE 20 MG PO TABS
60.0000 mg | ORAL_TABLET | Freq: Once | ORAL | Status: AC
  Administered 2014-05-28: 60 mg via ORAL
  Filled 2014-05-28: qty 3

## 2014-05-28 MED ORDER — ALBUTEROL SULFATE (2.5 MG/3ML) 0.083% IN NEBU
2.5000 mg | INHALATION_SOLUTION | Freq: Once | RESPIRATORY_TRACT | Status: AC
  Administered 2014-05-28: 2.5 mg via RESPIRATORY_TRACT
  Filled 2014-05-28: qty 3

## 2014-05-28 MED ORDER — IPRATROPIUM-ALBUTEROL 0.5-2.5 (3) MG/3ML IN SOLN
3.0000 mL | Freq: Once | RESPIRATORY_TRACT | Status: AC
  Administered 2014-05-28: 3 mL via RESPIRATORY_TRACT
  Filled 2014-05-28: qty 3

## 2014-05-28 MED ORDER — PREDNISONE 20 MG PO TABS: 60.0000 mg | ORAL_TABLET | Freq: Every day | ORAL | Status: DC

## 2014-05-28 NOTE — ED Provider Notes (Signed)
CSN: [id number]     Arrival date & time 05/28/14  0932 History   First MD Initiated Contact with Patient 05/28/14 [phone]     Chief Complaint  Patient presents with  . Cough  . Shortness of Breath     (Consider location/radiation/quality/duration/timing/severity/associated sxs/prior Treatment) HPI  This is a 43 year old female with a history of asthma who presents with shortness of breath, cough, and sore throat. Patient reports onset of symptoms on Thursday. She states worsening of the symptoms on Saturday. She reports a cough that is productive of green sputum. She also reports chills without documented fevers. She's had increasing shortness of breath and her inhaler hasn't been helping her. She reports sore throat without difficulty swallowing. She does report chest pain but only with coughing and describes as a "soreness." Last had an asthma exacerbation in August. Has been hospitalized in the past.  Past Medical History  Diagnosis Date  . UTI (urinary tract infection)   . Eczema   . Abnormal Pap smear 98    cryo therapy  . Asthma   . STD (female)    Past Surgical History  Procedure Laterality Date  . Cesarean section    . Therapeutic abortion     Family History  Problem Relation Age of Onset  . Hypertension Mother   . Diabetes Mother   . Hyperlipidemia Mother   . Heart disease Mother   . Stroke Mother   . Heart disease Maternal Grandmother   . Hypertension Maternal Grandmother   . Diabetes Paternal Grandmother    History  Substance Use Topics  . Smoking status: Never Smoker   . Smokeless tobacco: Never Used  . Alcohol Use: Yes     Comment: socially   OB History   Grav Para Term Preterm Abortions TAB SAB Ect Mult Living   4 3 3  0 1 1 0 0 0 3     Review of Systems  Constitutional: Positive for chills. Negative for fever.  HENT: Positive for sore throat.   Respiratory: Positive for cough and shortness of breath. Negative for chest tightness.   Cardiovascular:  Positive for chest pain. Negative for leg swelling.  Gastrointestinal: Negative for nausea, vomiting and abdominal pain.  Genitourinary: Negative for dysuria.  Skin: Negative for rash.  Neurological: Negative for headaches.  All other systems reviewed and are negative.     Allergies  Penicillins and Flagyl  Home Medications   Prior to Admission medications   Medication Sig Start Date End Date Taking? Authorizing Provider  albuterol (PROVENTIL HFA;VENTOLIN HFA) 108 (90 BASE) MCG/ACT inhaler Inhale 2 puffs into the lungs every 6 (six) hours as needed for wheezing or shortness of breath. 04/04/13  Yes Robbie Lis, MD  ibuprofen (ADVIL,MOTRIN) 200 MG tablet Take 400 mg by mouth every 6 (six) hours as needed for pain.   Yes Historical Provider, MD  PE-DM-APAP & Doxylamin-DM-APAP (VICKS DAYQUIL/NYQUIL CLD & FLU) (LIQUID) MISC Take 20 mLs by mouth every 6 (six) hours.   Yes Historical Provider, MD  predniSONE (DELTASONE) 20 MG tablet Take 60 mg by mouth daily as needed (for breathing).   Yes Historical Provider, MD  beclomethasone (QVAR) 80 MCG/ACT inhaler Inhale 1 puff into the lungs 2 (two) times daily. 05/01/13   Juanito Doom, MD  predniSONE (DELTASONE) 20 MG tablet Take 3 tablets (60 mg total) by mouth daily with breakfast. 05/28/14   Merryl Hacker, MD   BP 119/71  Pulse 113  Temp(Src) 97.4 F (36.3 C) (  Oral)  Resp 20  SpO2 94%  LMP 05/07/2014 Physical Exam  Nursing note and vitals reviewed. Constitutional: She is oriented to person, place, and time. She appears well-developed and well-nourished. No distress.  HENT:  Head: Normocephalic and atraumatic.  Mouth/Throat: Oropharynx is clear and moist. No oropharyngeal exudate.  Uvula midline  Eyes: Pupils are equal, round, and reactive to light.  Neck: Neck supple.  Cardiovascular: Normal rate, regular rhythm and normal heart sounds.   No murmur heard. Pulmonary/Chest: Effort normal. No respiratory distress. She has  wheezes. She exhibits tenderness.  Course wheezing in all lung fields  Abdominal: Soft. Bowel sounds are normal. There is no tenderness. There is no rebound.  Musculoskeletal: She exhibits no edema.  Neurological: She is alert and oriented to person, place, and time.  Skin: Skin is warm and dry.  Psychiatric: She has a normal mood and affect.    ED Course  Procedures (including critical care time) Labs Review Labs Reviewed - No data to display  Imaging Review Dg Chest 2 View  05/28/2014   CLINICAL DATA:  Four-day history of cough chest pain and congestion with fever ; history of asthma; no history of tobacco use ; previous history of acute respiratory failure and hypoxia  EXAM: CHEST  2 VIEW  COMPARISON:  PA and lateral chest x-ray of March 19, 2014  FINDINGS: The lungs are borderline hyperinflated. There is no focal infiltrate. The infrahilar interstitial markings are mildly increased especially on the left. They are fairly stable on the right. The heart and mediastinal structures are normal. There is no pleural effusion. The bony thorax is unremarkable.  IMPRESSION: Borderline hyperinflation with coarse infrahilar lung markings may reflect the patient's known reactive airway disease. One cannot exclude superimposed acute bronchitis in the appropriate clinical setting.   Electronically Signed   By: David  Martinique   On: 05/28/2014 09:31     EKG Interpretation None      MDM   Final diagnoses:  Acute bronchitis, unspecified organism    Patient presents with cough and chest pain. She's wheezing on exam. History of asthma. She is a nonsmoker. Patient given a duo neb and prednisone. Given productive cough and chills, will obtain chest x-ray to rule out pneumonia. Chest x-ray is reassuring. Following treatment, patient reports improvement. Repeat exam with scant expiratory wheezing but largely improved. Patient able to ambulate with pulse ox without difficulty and maintaining her oxygen  saturations. Will place on burst dose steroids. Patient has an inhaler at home.  After history, exam, and medical workup I feel the patient has been appropriately medically screened and is safe for discharge home. Pertinent diagnoses were discussed with the patient. Patient was given return precautions.     Merryl Hacker, MD 05/28/14 1019

## 2014-05-28 NOTE — ED Notes (Signed)
Dr. Horton at bedside. 

## 2014-05-28 NOTE — Discharge Instructions (Signed)

## 2014-05-28 NOTE — ED Notes (Signed)
Patient reports asthma exacerbation and productive cough which started on Thursday Patient also reports that she found "black mold" in her home and that the exhaust fans at her place of employment were not working properly Patient able to speak in full, complete sentences without difficulty--handles secretions  Exp wheezing noted bilaterally

## 2014-05-28 NOTE — ED Notes (Signed)
MD at bedside. 

## 2014-05-28 NOTE — ED Notes (Addendum)
Pt reports productive cough with green sputum for past week, got worse on Saturday. Hx of asthma, chest pain only with coughing. Lung sounds diminished throughout. Also has sore throat. Speaking in complete sentences with no difficulty

## 2014-05-28 NOTE — ED Notes (Signed)
Patient transported to X-ray 

## 2014-06-11 ENCOUNTER — Encounter (HOSPITAL_COMMUNITY): Payer: Self-pay | Admitting: Emergency Medicine

## 2015-11-08 ENCOUNTER — Encounter (HOSPITAL_COMMUNITY): Payer: Self-pay | Admitting: *Deleted

## 2015-11-08 ENCOUNTER — Emergency Department (HOSPITAL_COMMUNITY)
Admission: EM | Admit: 2015-11-08 | Discharge: 2015-11-08 | Disposition: A | Discharge status: Home / Self Care | Payer: Medicaid Other | Attending: Emergency Medicine | Admitting: Emergency Medicine

## 2015-11-08 ENCOUNTER — Emergency Department (HOSPITAL_COMMUNITY): Payer: Medicaid Other

## 2015-11-08 DIAGNOSIS — Z7951 Long term (current) use of inhaled steroids: Secondary | ICD-10-CM | POA: Insufficient documentation

## 2015-11-08 DIAGNOSIS — Z87440 Personal history of urinary (tract) infections: Secondary | ICD-10-CM | POA: Insufficient documentation

## 2015-11-08 DIAGNOSIS — J45901 Unspecified asthma with (acute) exacerbation: Secondary | ICD-10-CM | POA: Insufficient documentation

## 2015-11-08 DIAGNOSIS — Z79899 Other long term (current) drug therapy: Secondary | ICD-10-CM | POA: Insufficient documentation

## 2015-11-08 DIAGNOSIS — R0602 Shortness of breath: Secondary | ICD-10-CM

## 2015-11-08 DIAGNOSIS — Z872 Personal history of diseases of the skin and subcutaneous tissue: Secondary | ICD-10-CM | POA: Insufficient documentation

## 2015-11-08 DIAGNOSIS — Z8619 Personal history of other infectious and parasitic diseases: Secondary | ICD-10-CM | POA: Insufficient documentation

## 2015-11-08 DIAGNOSIS — Z880 Allergy status to penicillin: Secondary | ICD-10-CM | POA: Insufficient documentation

## 2015-11-08 MED ORDER — IPRATROPIUM BROMIDE 0.02 % IN SOLN
0.5000 mg | Freq: Once | RESPIRATORY_TRACT | Status: AC
  Administered 2015-11-08: 0.5 mg via RESPIRATORY_TRACT
  Filled 2015-11-08: qty 2.5

## 2015-11-08 MED ORDER — ALBUTEROL SULFATE (2.5 MG/3ML) 0.083% IN NEBU
5.0000 mg | INHALATION_SOLUTION | Freq: Once | RESPIRATORY_TRACT | Status: AC
  Administered 2015-11-08: 5 mg via RESPIRATORY_TRACT
  Filled 2015-11-08: qty 6

## 2015-11-08 MED ORDER — PREDNISONE 20 MG PO TABS
60.0000 mg | ORAL_TABLET | Freq: Once | ORAL | Status: AC
  Administered 2015-11-08: 60 mg via ORAL
  Filled 2015-11-08: qty 3

## 2015-11-08 MED ORDER — ALBUTEROL SULFATE HFA 108 (90 BASE) MCG/ACT IN AERS
2.0000 | INHALATION_SPRAY | RESPIRATORY_TRACT | Status: DC
  Administered 2015-11-08: 2 via RESPIRATORY_TRACT
  Filled 2015-11-08: qty 6.7

## 2015-11-08 MED ORDER — PREDNISONE 20 MG PO TABS: 40.0000 mg | ORAL_TABLET | Freq: Every day | ORAL | Status: DC

## 2015-11-08 NOTE — Discharge Instructions (Signed)
Asthma, Adult Asthma is a recurring condition in which the airways tighten and narrow. Asthma can make it difficult to breathe. It can cause coughing, wheezing, and shortness of breath. Asthma episodes, also called asthma attacks, range from minor to life-threatening. Asthma cannot be cured, but medicines and lifestyle changes can help control it. CAUSES Asthma is believed to be caused by inherited (genetic) and environmental factors, but its exact cause is unknown. Asthma may be triggered by allergens, lung infections, or irritants in the air. Asthma triggers are different for each person. Common triggers include:   Animal dander.  Dust mites.  Cockroaches.  Pollen from trees or grass.  Mold.  Smoke.  Air pollutants such as dust, household cleaners, hair sprays, aerosol sprays, paint fumes, strong chemicals, or strong odors.  Cold air, weather changes, and winds (which increase molds and pollens in the air).  Strong emotional expressions such as crying or laughing hard.  Stress.  Certain medicines (such as aspirin) or types of drugs (such as beta-blockers).  Sulfites in foods and drinks. Foods and drinks that may contain sulfites include dried fruit, potato chips, and sparkling grape juice.  Infections or inflammatory conditions such as the flu, a cold, or an inflammation of the nasal membranes (rhinitis).  Gastroesophageal reflux disease (GERD).  Exercise or strenuous activity. SYMPTOMS Symptoms may occur immediately after asthma is triggered or many hours later. Symptoms include:  Wheezing.  Excessive nighttime or early morning coughing.  Frequent or severe coughing with a common cold.  Chest tightness.  Shortness of breath. DIAGNOSIS  The diagnosis of asthma is made by a review of your medical history and a physical exam. Tests may also be performed. These may include:  Lung function studies. These tests show how much air you breathe in and out.  Allergy  tests.  Imaging tests such as X-rays. TREATMENT  Asthma cannot be cured, but it can usually be controlled. Treatment involves identifying and avoiding your asthma triggers. It also involves medicines. There are 2 classes of medicine used for asthma treatment:   Controller medicines. These prevent asthma symptoms from occurring. They are usually taken every day.  Reliever or rescue medicines. These quickly relieve asthma symptoms. They are used as needed and provide short-term relief. Your health care provider will help you create an asthma action plan. An asthma action plan is a written plan for managing and treating your asthma attacks. It includes a list of your asthma triggers and how they may be avoided. It also includes information on when medicines should be taken and when their dosage should be changed. An action plan may also involve the use of a device called a peak flow meter. A peak flow meter measures how well the lungs are working. It helps you monitor your condition. HOME CARE INSTRUCTIONS   Take medicines only as directed by your health care provider. Speak with your health care provider if you have questions about how or when to take the medicines.  Use a peak flow meter as directed by your health care provider. Record and keep track of readings.  Understand and use the action plan to help minimize or stop an asthma attack without needing to seek medical care.  Control your home environment in the following ways to help prevent asthma attacks:  Do not smoke. Avoid being exposed to secondhand smoke.  Change your heating and air conditioning filter regularly.  Limit your use of fireplaces and wood stoves.  Get rid of pests (such as roaches   and mice) and their droppings.  Throw away plants if you see mold on them.  Clean your floors and dust regularly. Use unscented cleaning products.  Try to have someone else vacuum for you regularly. Stay out of rooms while they are  being vacuumed and for a short while afterward. If you vacuum, use a dust mask from a hardware store, a double-layered or microfilter vacuum cleaner bag, or a vacuum cleaner with a HEPA filter.  Replace carpet with wood, tile, or vinyl flooring. Carpet can trap dander and dust.  Use allergy-proof pillows, mattress covers, and box spring covers.  Wash bed sheets and blankets every week in hot water and dry them in a dryer.  Use blankets that are made of polyester or cotton.  Clean bathrooms and kitchens with bleach. If possible, have someone repaint the walls in these rooms with mold-resistant paint. Keep out of the rooms that are being cleaned and painted.  Wash hands frequently. SEEK MEDICAL CARE IF:   You have wheezing, shortness of breath, or a cough even if taking medicine to prevent attacks.  The colored mucus you cough up (sputum) is thicker than usual.  Your sputum changes from clear or white to yellow, green, gray, or bloody.  You have any problems that may be related to the medicines you are taking (such as a rash, itching, swelling, or trouble breathing).  You are using a reliever medicine more than 2-3 times per week.  Your peak flow is still at 50-79% of your personal best after following your action plan for 1 hour.  You have a fever. SEEK IMMEDIATE MEDICAL CARE IF:   You seem to be getting worse and are unresponsive to treatment during an asthma attack.  You are short of breath even at rest.  You get short of breath when doing very little physical activity.  You have difficulty eating, drinking, or talking due to asthma symptoms.  You develop chest pain.  You develop a fast heartbeat.  You have a bluish color to your lips or fingernails.  You are light-headed, dizzy, or faint.  Your peak flow is less than 50% of your personal best.   This information is not intended to replace advice given to you by your health care provider. Make sure you discuss any  questions you have with your health care provider.   Document Released: 07/27/2005 Document Revised: 04/17/2015 Document Reviewed: 02/23/2013 Elsevier Interactive Patient Education 2016 [employer]  

## 2015-11-08 NOTE — ED Notes (Signed)
Per pt report: pt presents with a cough x 2 weeks but has gotten progressively worse for the past two days.  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  Pt has been medicating with mucinex. Pt a/o x 4 and ambulatory.

## 2015-11-08 NOTE — ED Provider Notes (Signed)
CSN: [id number]     Arrival date & time 11/08/15  0919 History   First MD Initiated Contact with Patient 11/08/15 [phone]     No chief complaint on file.    (Consider location/radiation/quality/duration/timing/severity/associated sxs/prior Treatment) HPI Comments: Patient here complaining of increased cough and shortness of breath times several days. Denies any fever or vomiting or diarrhea. Has a history of asthma but has not been using her medications. No recent history of hospitalizations. States that symptoms seem to get worse at work when she is exposed to smoke. Does note some URI symptoms recently. No sore throat ear pain. Nothing makes her symptoms better.  The history is provided by the patient.    Past Medical History  Diagnosis Date  . UTI (urinary tract infection)   . Eczema   . Abnormal Pap smear 98    cryo therapy  . Asthma   . STD (female)    Past Surgical History  Procedure Laterality Date  . Cesarean section    . Therapeutic abortion     Family History  Problem Relation Age of Onset  . Hypertension Mother   . Diabetes Mother   . Hyperlipidemia Mother   . Heart disease Mother   . Stroke Mother   . Heart disease Maternal Grandmother   . Hypertension Maternal Grandmother   . Diabetes Paternal Grandmother    Social History  Substance Use Topics  . Smoking status: Never Smoker   . Smokeless tobacco: Never Used  . Alcohol Use: Yes     Comment: socially   OB History    Gravida Para Term Preterm AB TAB SAB Ectopic Multiple Living   4 3 3  0 1 1 0 0 0 3     Review of Systems  All other systems reviewed and are negative.     Allergies  Penicillins and Flagyl  Home Medications   Prior to Admission medications   Medication Sig Start Date End Date Taking? Authorizing Provider  albuterol (PROVENTIL HFA;VENTOLIN HFA) 108 (90 BASE) MCG/ACT inhaler Inhale 2 puffs into the lungs every 6 (six) hours as needed for wheezing or shortness of breath. 04/04/13   Robbie Lis, MD  beclomethasone (QVAR) 80 MCG/ACT inhaler Inhale 1 puff into the lungs 2 (two) times daily. 05/01/13   Juanito Doom, MD  ibuprofen (ADVIL,MOTRIN) 200 MG tablet Take 400 mg by mouth every 6 (six) hours as needed for pain.    Historical Provider, MD  PE-DM-APAP & Doxylamin-DM-APAP (VICKS DAYQUIL/NYQUIL CLD & FLU) (LIQUID) MISC Take 20 mLs by mouth every 6 (six) hours.    Historical Provider, MD  predniSONE (DELTASONE) 20 MG tablet Take 60 mg by mouth daily as needed (for breathing).    Historical Provider, MD  predniSONE (DELTASONE) 20 MG tablet Take 3 tablets (60 mg total) by mouth daily with breakfast. 05/28/14   Merryl Hacker, MD   There were no vitals taken for this visit. Physical Exam  Constitutional: She is oriented to person, place, and time. She appears well-developed and well-nourished.  Non-toxic appearance. No distress.  HENT:  Head: Normocephalic and atraumatic.  Eyes: Conjunctivae, EOM and lids are normal. Pupils are equal, round, and reactive to light.  Neck: Normal range of motion. Neck supple. No tracheal deviation present. No thyroid mass present.  Cardiovascular: Normal rate, regular rhythm and normal heart sounds.  Exam reveals no gallop.   No murmur heard. Pulmonary/Chest: Effort normal. No stridor. No respiratory distress. She has decreased breath sounds. She  has wheezes. She has no rhonchi. She has no rales.  Abdominal: Soft. Normal appearance and bowel sounds are normal. She exhibits no distension. There is no tenderness. There is no rebound and no CVA tenderness.  Musculoskeletal: Normal range of motion. She exhibits no edema or tenderness.  Neurological: She is alert and oriented to person, place, and time. She has normal strength. No cranial nerve deficit or sensory deficit. GCS eye subscore is 4. GCS verbal subscore is 5. GCS motor subscore is 6.  Skin: Skin is warm and dry. No abrasion and no rash noted.  Psychiatric: She has a normal mood and  affect. Her speech is normal and behavior is normal.  Nursing note and vitals reviewed.   ED Course  Procedures (including critical care time) Labs Review Labs Reviewed - No data to display  Imaging Review No results found. I have personally reviewed and evaluated these images and lab results as part of my medical decision-making.   EKG Interpretation None      MDM   Final diagnoses:  SOB (shortness of breath)    Patient given albuterol along with prednisone and Actos better. Will give albuterol inhaler to go home with as well as a short course of prednisone    Lacretia Leigh, MD 11/08/15 1130

## 2015-11-15 ENCOUNTER — Encounter (HOSPITAL_COMMUNITY): Payer: Self-pay | Admitting: Emergency Medicine

## 2015-11-15 ENCOUNTER — Emergency Department (HOSPITAL_COMMUNITY): Payer: Medicaid Other

## 2015-11-15 ENCOUNTER — Emergency Department (HOSPITAL_COMMUNITY)
Admission: EM | Admit: 2015-11-15 | Discharge: 2015-11-15 | Disposition: A | Discharge status: Home / Self Care | Payer: Medicaid Other | Attending: Emergency Medicine | Admitting: Emergency Medicine

## 2015-11-15 DIAGNOSIS — J069 Acute upper respiratory infection, unspecified: Secondary | ICD-10-CM

## 2015-11-15 DIAGNOSIS — Z3202 Encounter for pregnancy test, result negative: Secondary | ICD-10-CM | POA: Insufficient documentation

## 2015-11-15 DIAGNOSIS — Z872 Personal history of diseases of the skin and subcutaneous tissue: Secondary | ICD-10-CM | POA: Insufficient documentation

## 2015-11-15 DIAGNOSIS — R112 Nausea with vomiting, unspecified: Secondary | ICD-10-CM

## 2015-11-15 DIAGNOSIS — R197 Diarrhea, unspecified: Secondary | ICD-10-CM | POA: Insufficient documentation

## 2015-11-15 DIAGNOSIS — R079 Chest pain, unspecified: Secondary | ICD-10-CM | POA: Insufficient documentation

## 2015-11-15 DIAGNOSIS — Z880 Allergy status to penicillin: Secondary | ICD-10-CM | POA: Insufficient documentation

## 2015-11-15 DIAGNOSIS — Z87440 Personal history of urinary (tract) infections: Secondary | ICD-10-CM | POA: Insufficient documentation

## 2015-11-15 DIAGNOSIS — Z7951 Long term (current) use of inhaled steroids: Secondary | ICD-10-CM | POA: Insufficient documentation

## 2015-11-15 DIAGNOSIS — J45909 Unspecified asthma, uncomplicated: Secondary | ICD-10-CM | POA: Insufficient documentation

## 2015-11-15 DIAGNOSIS — Z8619 Personal history of other infectious and parasitic diseases: Secondary | ICD-10-CM | POA: Insufficient documentation

## 2015-11-15 DIAGNOSIS — Z79899 Other long term (current) drug therapy: Secondary | ICD-10-CM | POA: Insufficient documentation

## 2015-11-15 LAB — POC URINE PREG, ED: Preg Test, Ur: NEGATIVE

## 2015-11-15 LAB — URINALYSIS, ROUTINE W REFLEX MICROSCOPIC
Bilirubin Urine: NEGATIVE
Glucose, UA: NEGATIVE mg/dL
Hgb urine dipstick: NEGATIVE
Ketones, ur: NEGATIVE mg/dL
Leukocytes, UA: NEGATIVE
Nitrite: NEGATIVE
Protein, ur: NEGATIVE mg/dL
Specific Gravity, Urine: 1.008 (ref 1.005–1.030)
pH: 7 (ref 5.0–8.0)

## 2015-11-15 LAB — CBC
HEMATOCRIT: 37.1 % (ref 36.0–46.0)
Hemoglobin: 13.1 g/dL (ref 12.0–15.0)
MCH: 27.8 pg (ref 26.0–34.0)
MCHC: 35.3 g/dL (ref 30.0–36.0)
MCV: 78.6 fL (ref 78.0–100.0)
PLATELETS: 243 10*3/uL (ref 150–400)
RBC: 4.72 MIL/uL (ref 3.87–5.11)
RDW: 13.3 % (ref 11.5–15.5)
WBC: 3.3 10*3/uL — AB (ref 4.0–10.5)

## 2015-11-15 LAB — COMPREHENSIVE METABOLIC PANEL WITH GFR
ALT: 20 U/L (ref 14–54)
AST: 32 U/L (ref 15–41)
Albumin: 4.1 g/dL (ref 3.5–5.0)
Alkaline Phosphatase: 63 U/L (ref 38–126)
Anion gap: 8 (ref 5–15)
BUN: 5 mg/dL — ABNORMAL LOW (ref 6–20)
CO2: 25 mmol/L (ref 22–32)
Calcium: 8.8 mg/dL — ABNORMAL LOW (ref 8.9–10.3)
Chloride: 104 mmol/L (ref 101–111)
Creatinine, Ser: 0.74 mg/dL (ref 0.44–1.00)
GFR calc Af Amer: >60 mL/min
GFR calc non Af Amer: >60 mL/min
Glucose, Bld: 93 mg/dL (ref 65–99)
Potassium: 2.8 mmol/L — ABNORMAL LOW (ref 3.5–5.1)
Sodium: 137 mmol/L (ref 135–145)
Total Bilirubin: 0.5 mg/dL (ref 0.3–1.2)
Total Protein: 8.8 g/dL — ABNORMAL HIGH (ref 6.5–8.1)

## 2015-11-15 LAB — LIPASE, BLOOD: Lipase: 41 U/L (ref 11–51)

## 2015-11-15 MED ORDER — POTASSIUM CHLORIDE CRYS ER 20 MEQ PO TBCR
40.0000 meq | EXTENDED_RELEASE_TABLET | Freq: Once | ORAL | Status: AC
  Administered 2015-11-15: 40 meq via ORAL
  Filled 2015-11-15: qty 2

## 2015-11-15 MED ORDER — ONDANSETRON 4 MG PO TBDP: 4.0000 mg | ORAL_TABLET | Freq: Three times a day (TID) | ORAL | Status: DC | PRN

## 2015-11-15 MED ORDER — BENZONATATE 100 MG PO CAPS
100.0000 mg | ORAL_CAPSULE | Freq: Once | ORAL | Status: AC
  Administered 2015-11-15: 100 mg via ORAL
  Filled 2015-11-15: qty 1

## 2015-11-15 MED ORDER — SODIUM CHLORIDE 0.9 % IV BOLUS (SEPSIS)
1000.0000 mL | Freq: Once | INTRAVENOUS | Status: AC
  Administered 2015-11-15: 1000 mL via INTRAVENOUS

## 2015-11-15 MED ORDER — BENZONATATE 100 MG PO CAPS: 100.0000 mg | ORAL_CAPSULE | Freq: Three times a day (TID) | ORAL | Status: DC

## 2015-11-15 NOTE — ED Provider Notes (Signed)
CSN: [id number]     Arrival date & time 11/15/15  1428 History   First MD Initiated Contact with Patient 11/15/15 1508     Chief Complaint  Patient presents with  . Fever  . Nasal Congestion  . Emesis    HPI   Kristin Mason is a 45 y.o. female with a PMH of UTI, asthma, eczema who presents to the ED with fever, nasal congestion, productive cough, and nausea/vomiting/diarrhea. She states her cough started approximately 3 weeks ago. She reports associated chest pain with coughing. She states she was seen in the ED last Friday, and was discharged in stable condition with an albuterol inhaler and short steroid course. She reports she has tried mucinex for additional symptom relief. She notes progressively worsening symptoms since that time, and states on Monday, she started to experience nausea/vomiting/diarrhea. She notes 3 episodes of loose stools daily. She denies hematochezia or melena. She denies abdominal pain. 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  Past Medical History  Diagnosis Date  . UTI (urinary tract infection)   . Eczema   . Abnormal Pap smear 98    cryo therapy  . Asthma   . STD (female)    Past Surgical History  Procedure Laterality Date  . Cesarean section    . Therapeutic abortion     Family History  Problem Relation Age of Onset  . Hypertension Mother   . Diabetes Mother   . Hyperlipidemia Mother   . Heart disease Mother   . Stroke Mother   . Heart disease Maternal Grandmother   . Hypertension Maternal Grandmother   . Diabetes Paternal Grandmother    Social History  Substance Use Topics  . Smoking status: Never Smoker   . Smokeless tobacco: Never Used  . Alcohol Use: Yes     Comment: socially   OB History    Gravida Para Term Preterm AB TAB SAB Ectopic Multiple Living   4 3 3  0 1 1 0 0 0 3      Review of Systems  Constitutional: Positive for fever. Negative for chills.  HENT:  Positive for congestion.   Respiratory: Positive for cough. Negative for shortness of breath.   Cardiovascular: Positive for chest pain.  Gastrointestinal: Positive for nausea, vomiting and diarrhea. Negative for abdominal pain and blood in stool.  All other systems reviewed and are negative.     Allergies  Penicillins and Flagyl  Home Medications   Prior to Admission medications   Medication Sig Start Date End Date Taking? Authorizing Provider  acetaminophen (TYLENOL) 500 MG tablet Take 1,000 mg by mouth every 4 (four) hours as needed for mild pain or headache.   Yes Historical Provider, MD  albuterol (PROVENTIL HFA;VENTOLIN HFA) 108 (90 BASE) MCG/ACT inhaler Inhale 2 puffs into the lungs every 6 (six) hours as needed for wheezing or shortness of breath. 04/04/13  Yes Robbie Lis, MD  beclomethasone (QVAR) 80 MCG/ACT inhaler Inhale 1 puff into the lungs 2 (two) times daily. 05/01/13  Yes Juanito Doom, MD  Diphenhydramine-PE-APAP (MUCINEX FAST-MAX COLD FLU NGHT) 12.5-5-325 MG/10ML LIQD Take 20 mLs by mouth every 6 (six) hours as needed (cough congestion).    Yes Historical Provider, MD  benzonatate (TESSALON) 100 MG capsule Take 1 capsule (100 mg total) by mouth every 8 (eight) hours. 11/15/15   Marella Chimes, PA-C  ondansetron (ZOFRAN ODT) 4 MG disintegrating tablet Take 1 tablet (4 mg  total) by mouth every 8 (eight) hours as needed for nausea. 11/15/15   Marella Chimes, PA-C  predniSONE (DELTASONE) 20 MG tablet Take 2 tablets (40 mg total) by mouth daily. Patient not taking: Reported on 11/15/2015 11/08/15   Lacretia Leigh, MD    BP 126/86 mmHg  Pulse 88  Temp(Src) 98.2 F (36.8 C) (Oral)  Resp 16  SpO2 98%  LMP 11/06/2015 Physical Exam  Constitutional: She is oriented to person, place, and time. She appears well-developed and well-nourished. No distress.  HENT:  Head: Normocephalic and atraumatic.  Right Ear: External ear normal.  Left Ear: External ear normal.   Nose: Nose normal.  Mouth/Throat: Uvula is midline, oropharynx is clear and moist and mucous membranes are normal.  Eyes: Conjunctivae, EOM and lids are normal. Pupils are equal, round, and reactive to light. Right eye exhibits no discharge. Left eye exhibits no discharge. No scleral icterus.  Neck: Normal range of motion. Neck supple.  Cardiovascular: Normal rate, regular rhythm, normal heart sounds, intact distal pulses and normal pulses.   Pulmonary/Chest: Effort normal and breath sounds normal. No respiratory distress. She has no wheezes. She has no rales.  Lungs clear to auscultation bilaterally. No wheezing or increased work of breathing. Patient coughing on exam.  Abdominal: Soft. Normal appearance and bowel sounds are normal. She exhibits no distension and no mass. There is no tenderness. There is no rigidity, no rebound and no guarding.  Abdomen soft, non-tender, non-distended.  Musculoskeletal: Normal range of motion. She exhibits no edema or tenderness.  Neurological: She is alert and oriented to person, place, and time.  Skin: Skin is warm, dry and intact. No rash noted. She is not diaphoretic. No erythema. No pallor.  Psychiatric: She has a normal mood and affect. Her speech is normal and behavior is normal.  Nursing note and vitals reviewed.   ED Course  Procedures (including critical care time)  Labs Review Labs Reviewed  COMPREHENSIVE METABOLIC PANEL - Abnormal; Notable for the following:    Potassium 2.8 (*)    BUN 5 (*)    Calcium 8.8 (*)    Total Protein 8.8 (*)    All other components within normal limits  CBC - Abnormal; Notable for the following:    WBC 3.3 (*)    All other components within normal limits  LIPASE, BLOOD  URINALYSIS, ROUTINE W REFLEX MICROSCOPIC (NOT AT [facility])  POC URINE PREG, ED    Imaging Review Dg Chest 2 View  11/15/2015  CLINICAL DATA:  45 year old current history of asthma presenting with 3 day history of productive cough, shortness of  breath and anterior and posterior chest wall pain. EXAM: CHEST  2 VIEW COMPARISON:  11/08/2015, 05/28/2014 and earlier. FINDINGS: Cardiomediastinal silhouette unremarkable, unchanged. Mild central peribronchial thickening, unchanged. Lungs otherwise clear. No localized airspace consolidation. No pleural effusions. No pneumothorax. Normal pulmonary vascularity. Visualized bony thorax intact. IMPRESSION: Stable mild changes of chronic bronchitis and/or asthma. No acute cardiopulmonary disease. Electronically Signed   By: Evangeline Dakin M.D.   On: 11/15/2015 15:42   I have personally reviewed and evaluated these images and lab results as part of my medical decision-making.   EKG Interpretation None      MDM   Final diagnoses:  URI (upper respiratory infection)  Nausea, vomiting, and diarrhea    45 year old female presents with fever, nasal congestion, productive cough, and nausea/vomiting/diarrhea. Was evaluated for similar symptoms one week ago and discharged with an albuterol inhaler and short steroid  course. Denies abdominal pain, hematochezia, melena, dysuria, urgency, frequency. 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  Patient is afebrile. HR 102. Heart regular rhythm. Lungs clear to auscultation bilaterally. No wheezing. No increased work of breathing. Abdomen soft, nontender, nondistended. No rebound, guarding, or masses.  Patient given fluids and cough medicine.  CBC negative for leukocytosis or anemia. CMP remarkable for potassium 2.8, patient given oral potassium in the ED. Lipase within normal limits. Chest x-ray remarkable for stable mild changes of chronic bronchitis and/or asthma, no acute cardiopulmonary disease. Urine pregnancy negative. UA negative for infection.  Patient is non-toxic and well-appearing and reports significant symptom improvement, feel she is stable for discharge at this time. Repeat HR 88. Symptoms likely  viral. Will give cough medicine and antiemetic for home. Patient to follow-up with PCP. Return precautions discussed. Patient verbalizes her understanding and is in agreement with plan.  BP 126/86 mmHg  Pulse 88  Temp(Src) 98.2 F (36.8 C) (Oral)  Resp 16  SpO2 98%  LMP 11/06/2015      Marella Chimes, PA-C 11/15/15 1848  Veryl Speak, MD 11/15/15 2147

## 2015-11-15 NOTE — ED Notes (Signed)
PT DISCHARGED. INSTRUCTIONS AND PRESCRIPTIONS GIVEN. AAOX3. PT IN NO APPARENT DISTRESS OR PAIN. THE OPPORTUNITY TO ASK QUESTIONS WAS PROVIDED. 

## 2015-11-15 NOTE — Discharge Instructions (Signed)
1. Medications: tessalon for cough, zofran for nausea, usual home medications 2. Treatment: rest, drink plenty of fluids 3. Follow Up: please followup with your primary doctor as scheduled on the 17th for discussion of your diagnoses and further evaluation after today's visit; if you do not have a primary care doctor use the phone number listed in your discharge paperwork to find one; please return to the ER for high fever, shortness of breath, persistent vomiting, new or worsening symptoms   Nausea, Adult Nausea means you feel sick to your stomach or need to throw up (vomit). It may be a sign of a more serious problem. If nausea gets worse, you may throw up. If you throw up a lot, you may lose too much body fluid (dehydration). HOME CARE   Get plenty of rest.  Ask your doctor how to replace body fluid losses (rehydrate).  Eat small amounts of food. Sip liquids more often.  Take all medicines as told by your doctor. GET HELP RIGHT AWAY IF:  You have a fever.  You pass out (faint).  You keep throwing up or have blood in your throw up.  You are very weak, have dry lips or a dry mouth, or you are very thirsty (dehydrated).  You have dark or bloody poop (stool).  You have very bad chest or belly (abdominal) pain.  You do not get better after 2 days, or you get worse.  You have a headache. MAKE SURE YOU:  Understand these instructions.  Will watch your condition.  Will get help right away if you are not doing well or get worse.   This information is not intended to replace advice given to you by your health care provider. Make sure you discuss any questions you have with your health care provider.   Document Released: 07/16/2011 Document Revised: 10/19/2011 Document Reviewed: 07/16/2011 Elsevier Interactive Patient Education 2016 Elsevier Inc.  Upper Respiratory Infection, Adult Most upper respiratory infections (URIs) are caused by a virus. A URI affects the nose, throat,  and upper air passages. The most common type of URI is often called "the common cold." HOME CARE   Take medicines only as told by your doctor.  Gargle warm saltwater or take cough drops to comfort your throat as told by your doctor.  Use a warm mist humidifier or inhale steam from a shower to increase air moisture. This may make it easier to breathe.  Drink enough fluid to keep your pee (urine) clear or pale yellow.  Eat soups and other clear broths.  Have a healthy diet.  Rest as needed.  Go back to work when your fever is gone or your doctor says it is okay.  You may need to stay home longer to avoid giving your URI to others.  You can also wear a face mask and wash your hands often to prevent spread of the virus.  Use your inhaler more if you have asthma.  Do not use any tobacco products, including cigarettes, chewing tobacco, or electronic cigarettes. If you need help quitting, ask your doctor. GET HELP IF:  You are getting worse, not better.  Your symptoms are not helped by medicine.  You have chills.  You are getting more short of breath.  You have brown or red mucus.  You have yellow or brown discharge from your nose.  You have pain in your face, especially when you bend forward.  You have a fever.  You have puffy (swollen) neck glands.  You  have pain while swallowing.  You have white areas in the back of your throat. GET HELP RIGHT AWAY IF:   You have very bad or constant:  Headache.  Ear pain.  Pain in your forehead, behind your eyes, and over your cheekbones (sinus pain).  Chest pain.  You have long-lasting (chronic) lung disease and any of the following:  Wheezing.  Long-lasting cough.  Coughing up blood.  A change in your usual mucus.  You have a stiff neck.  You have changes in your:  Vision.  Hearing.  Thinking.  Mood. MAKE SURE YOU:   Understand these instructions.  Will watch your condition.  Will get help right  away if you are not doing well or get worse.   This information is not intended to replace advice given to you by your health care provider. Make sure you discuss any questions you have with your health care provider.   Document Released: 01/13/2008 Document Revised: 12/11/2014 Document Reviewed: 11/01/2013 Elsevier Interactive Patient Education [employer].

## 2015-11-15 NOTE — ED Notes (Signed)
Pt was seen a week ago for fever, N/V, and congestion. Pt returns to ED today for worsening of the same symptoms. A&Ox4 and ambulatory. Pt sts the flu has been going around at her work.

## 2017-05-06 IMAGING — CR DG CHEST 2V
2 series · 2 of 2 positions shown · non-contrast
Comparison: May 28, 2014

CLINICAL DATA: Shortness of breath and cough for 2 weeks

EXAM:
CHEST  2 VIEW

[w chest pa]
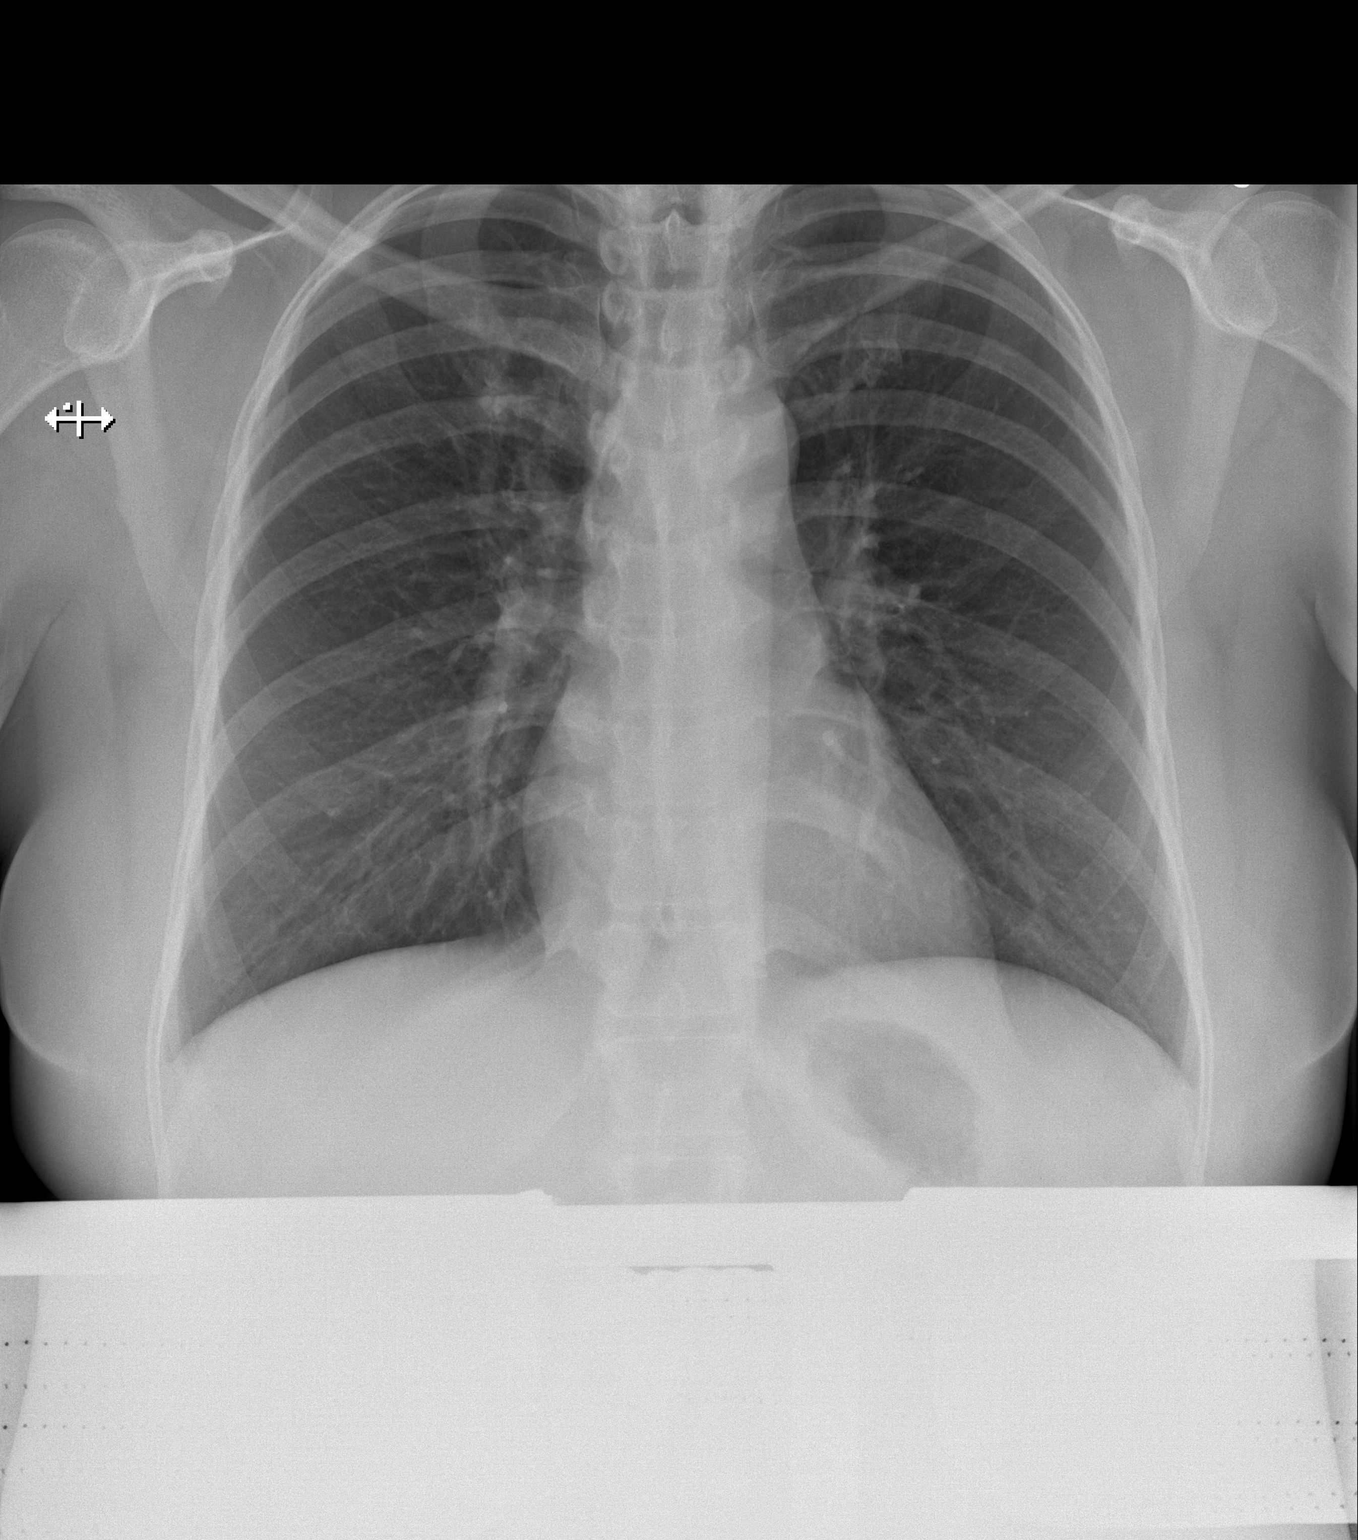

[w chest lat]
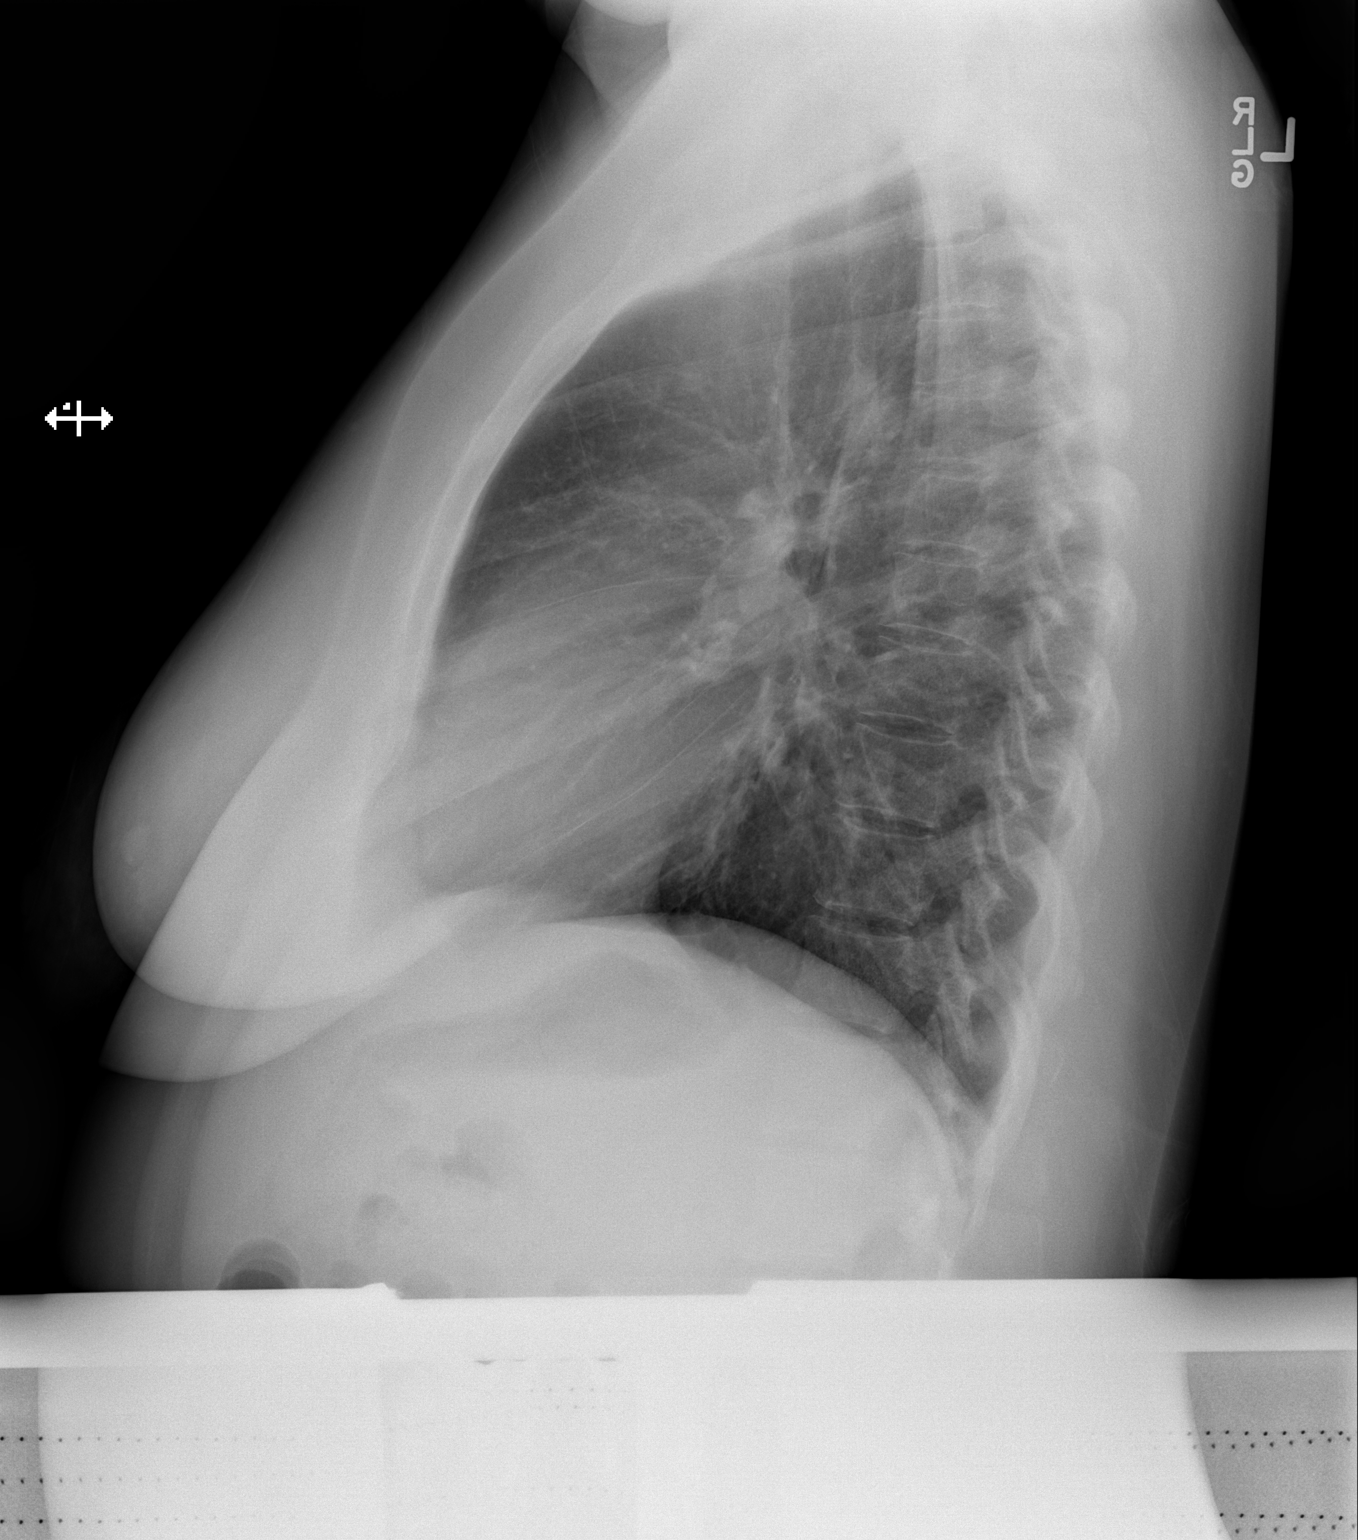

[2 of 2 positions shown; findings below may reference images not displayed]

FINDINGS: There is no edema or consolidation. The heart size and pulmonary
vascularity are normal. No adenopathy. No bone lesions.
IMPRESSION: No edema or consolidation.

## 2017-05-13 IMAGING — CR DG CHEST 2V
2 series · 2 of 2 positions shown · non-contrast
Comparison: 11/08/2015, 05/28/2014 and earlier.

CLINICAL DATA: 44-year-old current history of asthma presenting
with 3 day history of productive cough, shortness of breath and
anterior and posterior chest wall pain.

EXAM:
CHEST  2 VIEW

[w chest pa]
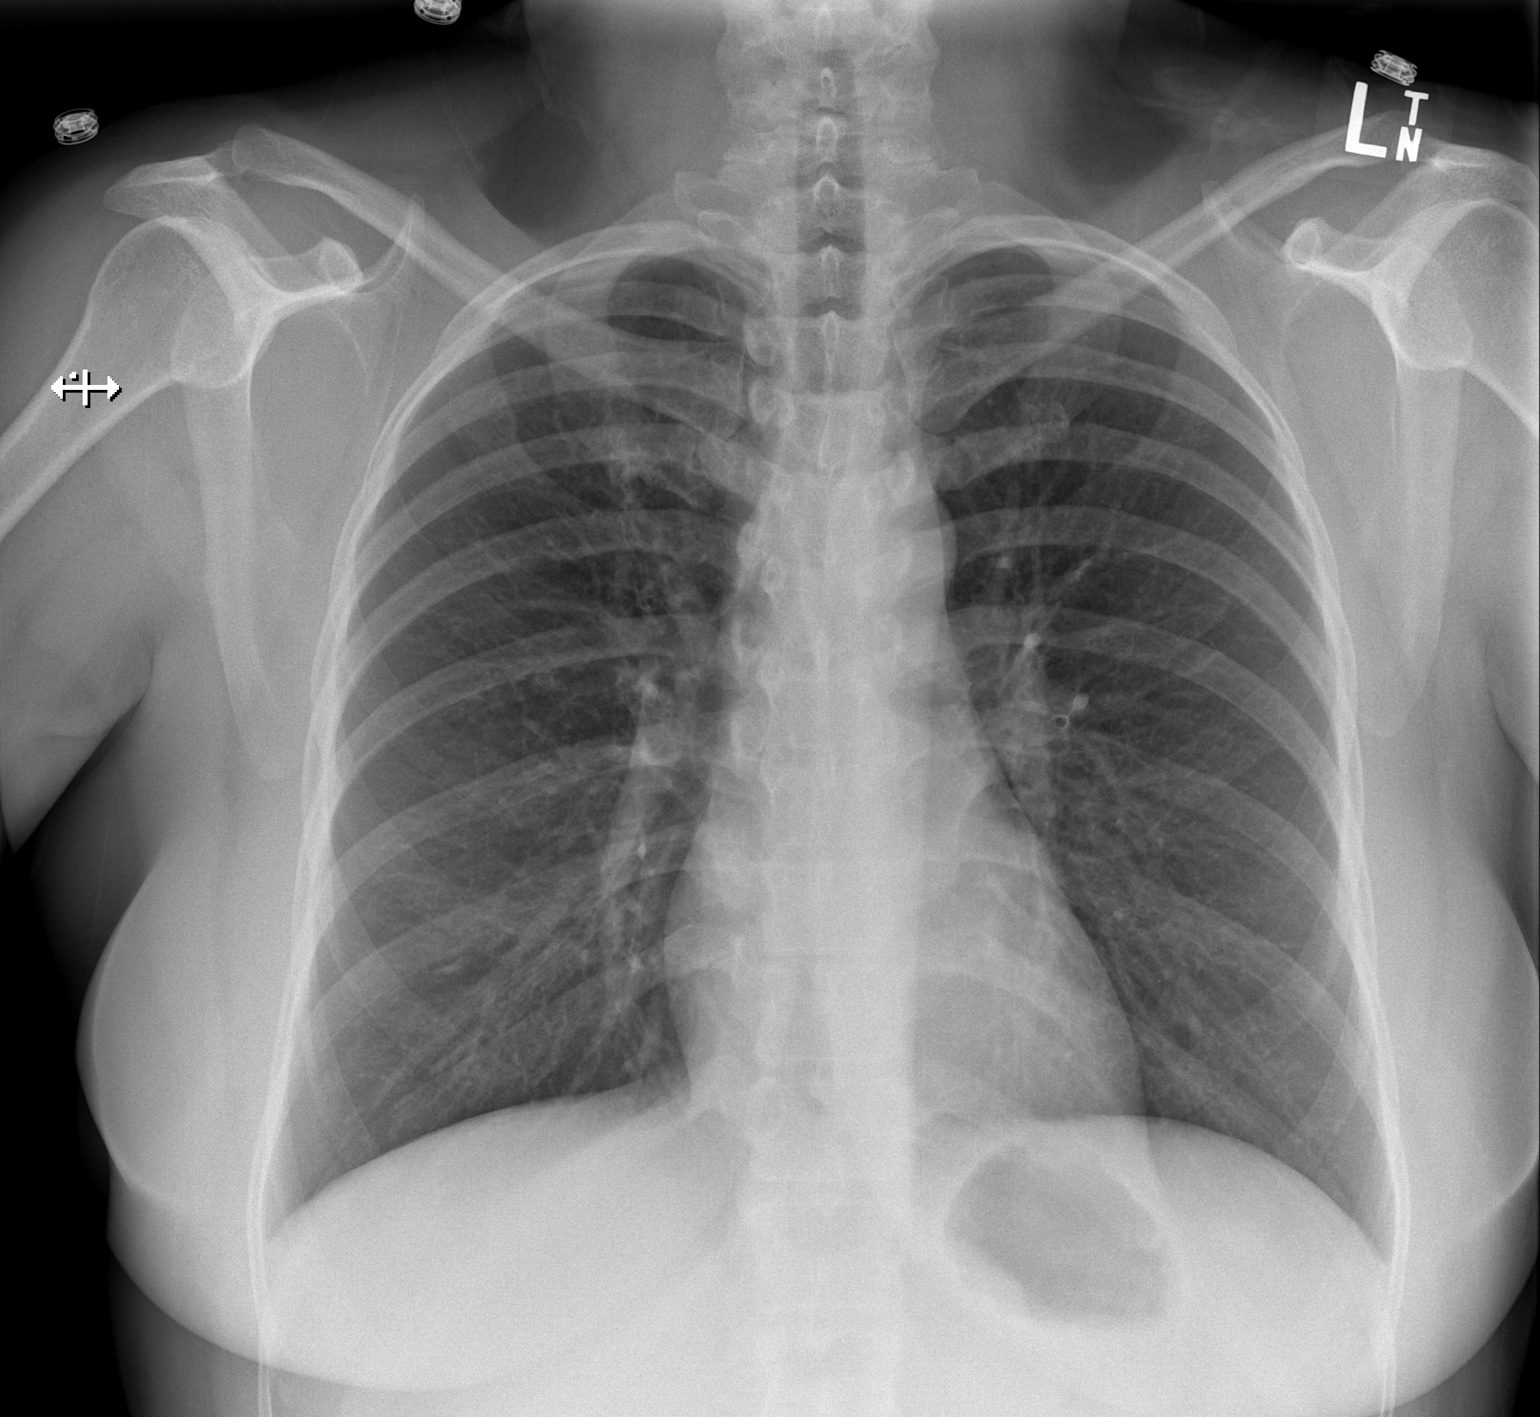

[w chest lat]
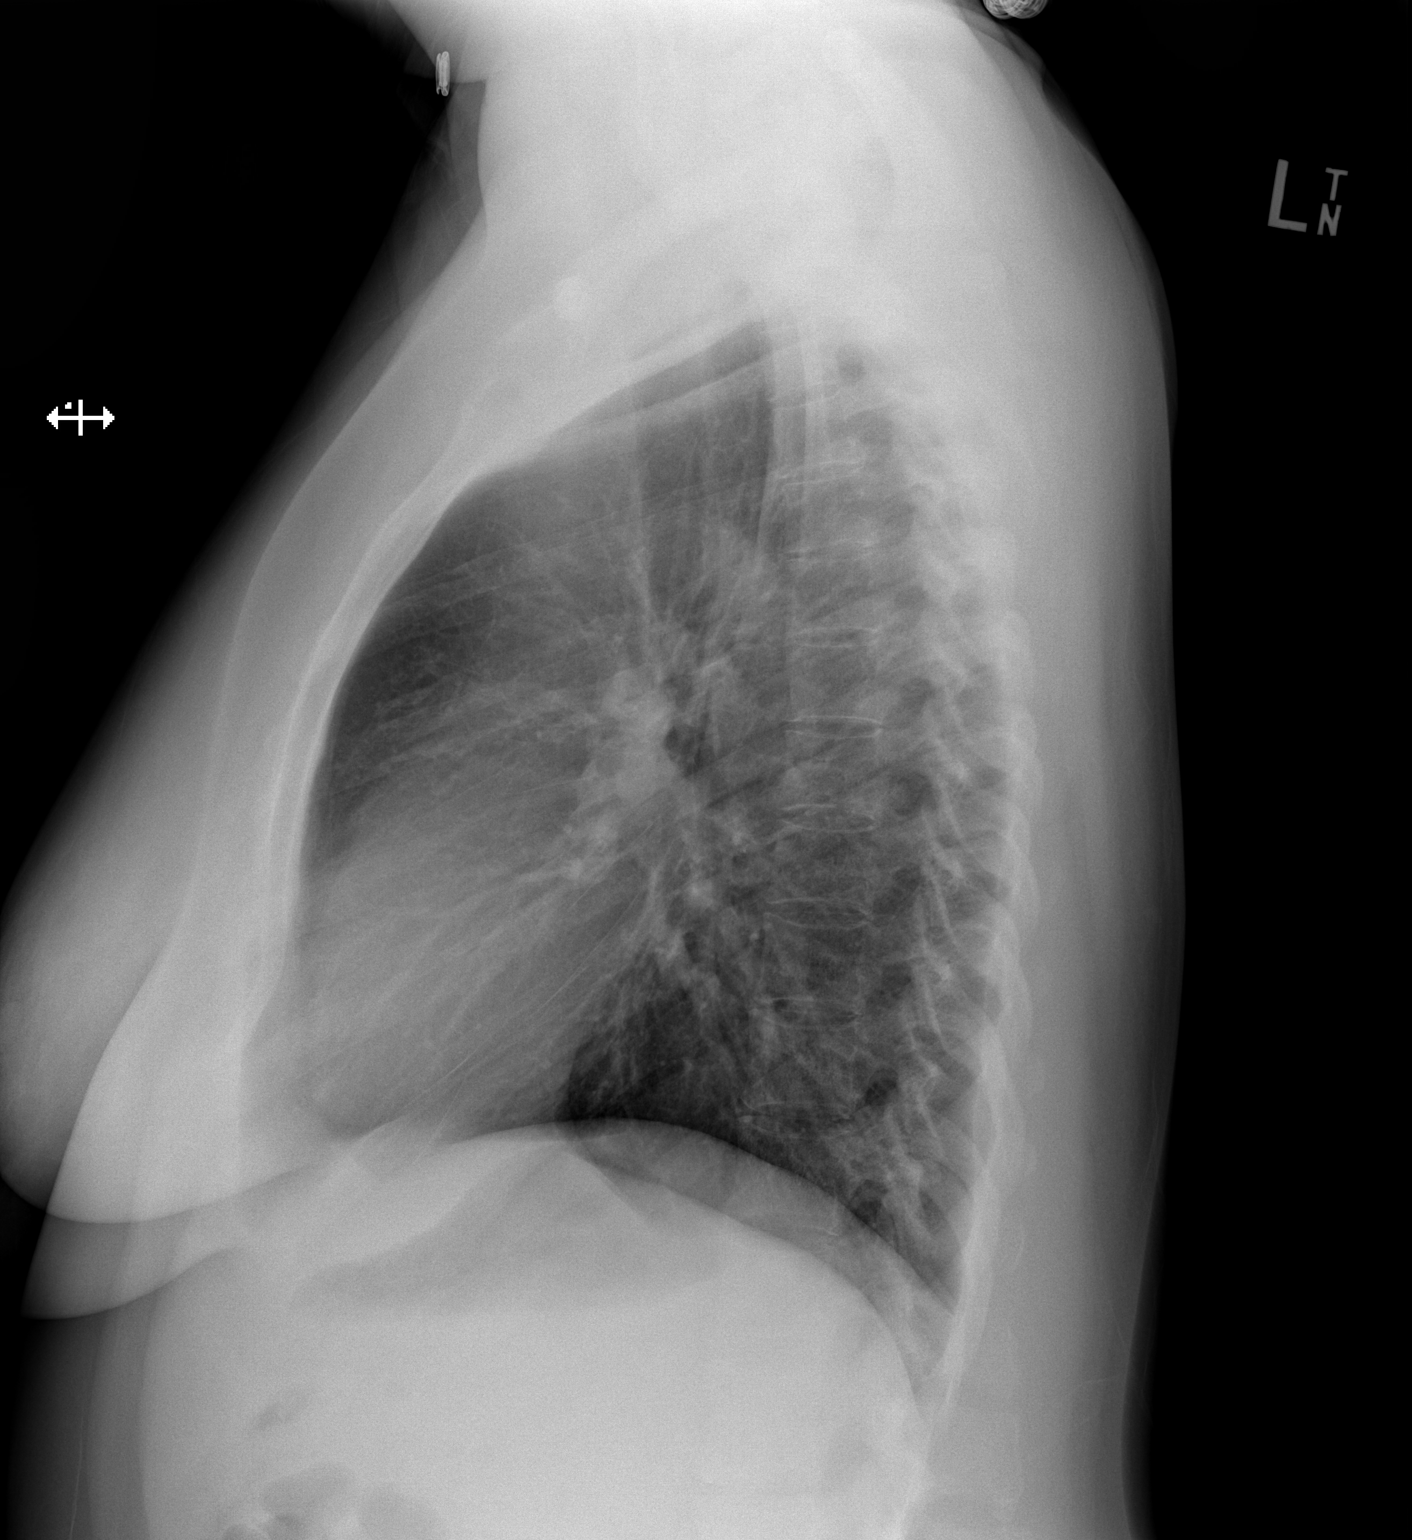

[2 of 2 positions shown; findings below may reference images not displayed]

FINDINGS: Cardiomediastinal silhouette unremarkable, unchanged. Mild central
peribronchial thickening, unchanged. Lungs otherwise clear. No
localized airspace consolidation. No pleural effusions. No
pneumothorax. Normal pulmonary vascularity. Visualized bony thorax
intact.
IMPRESSION: Stable mild changes of chronic bronchitis and/or asthma. No acute
cardiopulmonary disease.

## 2017-08-26 ENCOUNTER — Emergency Department (HOSPITAL_COMMUNITY)
Admission: EM | Admit: 2017-08-26 | Discharge: 2017-08-26 | Disposition: A | Discharge status: Home / Self Care | Payer: Self-pay | Attending: Emergency Medicine | Admitting: Emergency Medicine

## 2017-08-26 ENCOUNTER — Other Ambulatory Visit: Payer: Self-pay

## 2017-08-26 ENCOUNTER — Emergency Department (HOSPITAL_COMMUNITY): Payer: Self-pay

## 2017-08-26 ENCOUNTER — Encounter (HOSPITAL_COMMUNITY): Payer: Self-pay | Admitting: Emergency Medicine

## 2017-08-26 DIAGNOSIS — Z79899 Other long term (current) drug therapy: Secondary | ICD-10-CM | POA: Insufficient documentation

## 2017-08-26 DIAGNOSIS — J069 Acute upper respiratory infection, unspecified: Secondary | ICD-10-CM | POA: Insufficient documentation

## 2017-08-26 DIAGNOSIS — J45901 Unspecified asthma with (acute) exacerbation: Secondary | ICD-10-CM | POA: Insufficient documentation

## 2017-08-26 MED ORDER — PREDNISONE 10 MG PO TABS: ORAL_TABLET | ORAL | 0 refills | Status: DC

## 2017-08-26 MED ORDER — ALBUTEROL SULFATE HFA 108 (90 BASE) MCG/ACT IN AERS: 1.0000 | INHALATION_SPRAY | Freq: Four times a day (QID) | RESPIRATORY_TRACT | 0 refills | Status: AC | PRN

## 2017-08-26 MED ORDER — PREDNISONE 20 MG PO TABS
60.0000 mg | ORAL_TABLET | Freq: Every day | ORAL | Status: DC
  Administered 2017-08-26: 60 mg via ORAL
  Filled 2017-08-26: qty 3

## 2017-08-26 MED ORDER — ALBUTEROL SULFATE HFA 108 (90 BASE) MCG/ACT IN AERS
2.0000 | INHALATION_SPRAY | RESPIRATORY_TRACT | Status: DC
  Administered 2017-08-26: 2 via RESPIRATORY_TRACT
  Filled 2017-08-26: qty 6.7

## 2017-08-26 NOTE — Discharge Instructions (Signed)
See your Physician for recheck if symptoms persist more than 1 week

## 2017-08-26 NOTE — ED Provider Notes (Signed)
[facility] DEPT Provider Note   CSN: [id number] Arrival date & time: 08/26/17  5732     History   Chief Complaint Chief Complaint  Patient presents with  . Cough    HPI Kristin Mason is a 47 y.o. female.  The history is provided by the patient. No language interpreter was used.  Cough  This is a new problem. The current episode started more than 1 week ago. The problem occurs constantly. The cough is non-productive. There has been no fever. Associated symptoms include wheezing. She has tried nothing for the symptoms. The treatment provided no relief. She is not a smoker.  Pt complains of a cough and congestion.  Pt has a history of asthma.  She is out of her inhaler  Past Medical History:  Diagnosis Date  . Abnormal Pap smear 98   cryo therapy  . Asthma   . Eczema   . STD (female)   . UTI (urinary tract infection)     Patient Active Problem List   Diagnosis Date Noted  . Unspecified asthma(493.90) 05/01/2013  . Rash and nonspecific skin eruption 05/01/2013  . Allergic rhinitis 05/01/2013  . Hypokalemia 04/03/2013  . Acute respiratory failure with hypoxia ([place]) 04/03/2013    Past Surgical History:  Procedure Laterality Date  . CESAREAN SECTION    . THERAPEUTIC ABORTION      OB History    Gravida Para Term Preterm AB Living   4 3 3  0 1 3   SAB TAB Ectopic Multiple Live Births   0 1 0 0         Home Medications    Prior to Admission medications   Medication Sig Start Date End Date Taking? Authorizing Provider  acetaminophen (TYLENOL) 500 MG tablet Take 1,000 mg by mouth every 4 (four) hours as needed for mild pain or headache.    [provider]  albuterol (PROVENTIL HFA;VENTOLIN HFA) 108 (90 Base) MCG/ACT inhaler Inhale 1-2 puffs into the lungs every 6 (six) hours as needed for wheezing or shortness of breath. 08/26/17   Fransico Meadow, PA-C  beclomethasone (QVAR) 80 MCG/ACT inhaler Inhale 1 puff into the  lungs 2 (two) times daily. 05/01/13   Juanito Doom, MD  benzonatate (TESSALON) 100 MG capsule Take 1 capsule (100 mg total) by mouth every 8 (eight) hours. 11/15/15   Marella Chimes, PA-C  Diphenhydramine-PE-APAP (MUCINEX FAST-MAX COLD FLU NGHT) 12.5-5-325 MG/10ML LIQD Take 20 mLs by mouth every 6 (six) hours as needed (cough congestion).     [provider]  ondansetron (ZOFRAN ODT) 4 MG disintegrating tablet Take 1 tablet (4 mg total) by mouth every 8 (eight) hours as needed for nausea. 11/15/15   Marella Chimes, PA-C  predniSONE (DELTASONE) 10 MG tablet 6,5,4,3,2,1 taper 08/26/17   Fransico Meadow, PA-C    Family History Family History  Problem Relation Age of Onset  . Heart disease Maternal Grandmother   . Hypertension Maternal Grandmother   . Hypertension Mother   . Diabetes Mother   . Hyperlipidemia Mother   . Heart disease Mother   . Stroke Mother   . Diabetes Paternal Grandmother     Social History Social History   Tobacco Use  . Smoking status: Never Smoker  . Smokeless tobacco: Never Used  Substance Use Topics  . Alcohol use: Yes    Comment: socially  . Drug use: No     Allergies   Penicillins and Flagyl [metronidazole]  Review of Systems Review of Systems  Respiratory: Positive for cough and wheezing.   All other systems reviewed and are negative.    Physical Exam Updated Vital Signs BP 124/85 (BP Location: Right Arm)   Pulse 100   Temp 98.1 F (36.7 C) (Oral)   Resp 16   Ht 5\' 3"  (1.6 m)   Wt 65.3 kg (144 lb)   LMP 08/17/2017 (Exact Date)   SpO2 100%   BMI 25.51 kg/m   Physical Exam  Constitutional: She appears well-developed and well-nourished.  HENT:  Head: Normocephalic.  Right Ear: External ear normal.  Eyes: Conjunctivae are normal. Pupils are equal, round, and reactive to light.  Neck: Normal range of motion.  Cardiovascular: Normal rate.  Pulmonary/Chest: Effort normal. She has wheezes. She exhibits no  tenderness.  Abdominal: Soft.  Musculoskeletal: Normal range of motion.  Neurological: She is alert.  Skin: Skin is warm.  Psychiatric: She has a normal mood and affect.  Vitals reviewed.    ED Treatments / Results  Labs (all labs ordered are listed, but only abnormal results are displayed) Labs Reviewed - No data to display  EKG  EKG Interpretation None       Radiology Dg Chest 2 View  Result Date: 08/26/2017 CLINICAL DATA:  Cough. EXAM: CHEST  2 VIEW COMPARISON:  Radiographs of November 15, 2015. FINDINGS: The heart size and mediastinal contours are within normal limits. Both lungs are clear. No pneumothorax or pleural effusion is noted. The visualized skeletal structures are unremarkable. IMPRESSION: No active cardiopulmonary disease. Electronically Signed   By: Marijo Conception, M.D.   On: 08/26/2017 10:44    Procedures Procedures (including critical care time)  Medications Ordered in ED Medications  albuterol (PROVENTIL HFA;VENTOLIN HFA) 108 (90 Base) MCG/ACT inhaler 2 puff (2 puffs Inhalation Given 08/26/17 1004)  predniSONE (DELTASONE) tablet 60 mg (60 mg Oral Given 08/26/17 1111)     Initial Impression / Assessment and Plan / ED Course  I have reviewed the triage vital signs and the nursing notes.  Pertinent labs & imaging results that were available during my care of the patient were reviewed by me and considered in my medical decision making (see chart for details).     Pt given prednisone and an albuterol inhaler.  Pt advised chest xray is normal.  I suspect illness is viral.  Final Clinical Impressions(s) / ED Diagnoses   Final diagnoses:  Acute upper respiratory infection  Mild asthma with exacerbation, unspecified whether persistent    ED Discharge Orders        Ordered    predniSONE (DELTASONE) 10 MG tablet     08/26/17 1054    albuterol (PROVENTIL HFA;VENTOLIN HFA) 108 (90 Base) MCG/ACT inhaler  Every 6 hours PRN     08/26/17 1056    An After  Visit Summary was printed and given to the patient.   Fransico Meadow, [place] 08/26/17 1143    Carmin Muskrat, MD 08/26/17 [phone]

## 2017-08-26 NOTE — ED Notes (Signed)
Bed: WTR5 Expected date:  Expected time:  Means of arrival:  Comments: 

## 2017-08-26 NOTE — ED Triage Notes (Signed)
Pt states she has had a cough since Friday and it has gotten worse  Pt states she has green/yellow phlegm  Hx of asthma  Has had a low grade fever  States back and chest is sore from coughing

## 2017-09-29 ENCOUNTER — Ambulatory Visit (INDEPENDENT_AMBULATORY_CARE_PROVIDER_SITE_OTHER): Payer: Self-pay | Admitting: *Deleted

## 2017-09-29 DIAGNOSIS — Z3202 Encounter for pregnancy test, result negative: Secondary | ICD-10-CM

## 2017-09-29 DIAGNOSIS — Z3200 Encounter for pregnancy test, result unknown: Secondary | ICD-10-CM

## 2017-09-29 LAB — POCT PREGNANCY, URINE: Preg Test, Ur: NEGATIVE

## 2017-09-29 NOTE — Progress Notes (Signed)
I have reviewed the nursing note, and agree with the plan.  Kristin Mason 12:03 PM 09/29/17

## 2017-09-29 NOTE — Progress Notes (Signed)
Here for pregnancy test which was negative.  States period is usually regular and comes within 3-5 days of when she expects it and is 11 days late this time.  States having headaches, cramping, nausea and being tired. Would like to do bhcg today .

## 2017-09-30 ENCOUNTER — Telehealth: Payer: Self-pay | Admitting: *Deleted

## 2017-09-30 LAB — BETA HCG QUANT (REF LAB): hCG Quant: <1 m[IU]/mL

## 2017-09-30 NOTE — Telephone Encounter (Signed)
[name] called this pm requesting results of her blood test from yesterday. I called Kristin Mason and informed her bhcg was negative. She states she still has not had a period. I instructed her to repeat home upt in one week and if negative and no period may call for appointment with provider . She voices understanding.

## 2017-10-26 ENCOUNTER — Encounter: Payer: Self-pay | Admitting: Family Medicine

## 2017-11-10 ENCOUNTER — Encounter: Payer: Medicaid Other | Admitting: Obstetrics and Gynecology

## 2018-11-02 ENCOUNTER — Encounter (HOSPITAL_COMMUNITY): Payer: Self-pay

## 2018-11-02 ENCOUNTER — Other Ambulatory Visit: Payer: Self-pay

## 2018-11-02 ENCOUNTER — Emergency Department (HOSPITAL_COMMUNITY)
Admission: EM | Admit: 2018-11-02 | Discharge: 2018-11-02 | Disposition: A | Discharge status: Home / Self Care | Payer: Self-pay | Attending: Emergency Medicine | Admitting: Emergency Medicine

## 2018-11-02 DIAGNOSIS — H1033 Unspecified acute conjunctivitis, bilateral: Secondary | ICD-10-CM | POA: Insufficient documentation

## 2018-11-02 DIAGNOSIS — Z79899 Other long term (current) drug therapy: Secondary | ICD-10-CM | POA: Insufficient documentation

## 2018-11-02 DIAGNOSIS — J45909 Unspecified asthma, uncomplicated: Secondary | ICD-10-CM | POA: Insufficient documentation

## 2018-11-02 MED ORDER — TETRACAINE HCL 0.5 % OP SOLN
2.0000 [drp] | Freq: Once | OPHTHALMIC | Status: AC
  Administered 2018-11-02: 2 [drp] via OPHTHALMIC
  Filled 2018-11-02: qty 4

## 2018-11-02 MED ORDER — ERYTHROMYCIN 5 MG/GM OP OINT: TOPICAL_OINTMENT | OPHTHALMIC | 0 refills | Status: DC

## 2018-11-02 MED ORDER — ACETAMINOPHEN 325 MG PO TABS
650.0000 mg | ORAL_TABLET | Freq: Once | ORAL | Status: AC
  Administered 2018-11-02: 650 mg via ORAL
  Filled 2018-11-02: qty 2

## 2018-11-02 MED ORDER — FLUORESCEIN SODIUM 1 MG OP STRP
1.0000 | ORAL_STRIP | Freq: Once | OPHTHALMIC | Status: AC
  Administered 2018-11-02: 1 via OPHTHALMIC
  Filled 2018-11-02: qty 1

## 2018-11-02 NOTE — ED Triage Notes (Signed)
Pt reports that she woke Friday with right eye redness and drainage. Pt reports that now both eyes are red, itching, and matted shut in the am. Pt is [employer] employee and was told by health at work that she needed a work note to return

## 2018-11-02 NOTE — ED Provider Notes (Signed)
[facility] DEPT Provider Note   CSN: [id number] Arrival date & time: 11/02/18  1136    History   Chief Complaint Chief Complaint  Patient presents with  . Conjunctivitis    HPI Kristin Mason is a 48 y.o. female with a history of asthma and eczema who presents to the emergency department with complaints of bilateral eye irritation for the past 4 to 5 days.  Patient states that symptoms began in the right eye and then seemed to spread to the left eye.  She reports eye discharge/crusting that is worse in the morning, eye erythema, eye pruritus, as well as some discomfort.  Other than being worse in the morning no other triggers or alleviating/aggravating factors.  Denies change in vision.  Denies fever, chills, congestion, rhinorrhea, ear pain, or sore throat.  Patient is not a contact lens wear.     HPI  Past Medical History:  Diagnosis Date  . Abnormal Pap smear 98   cryo therapy  . Asthma   . Eczema   . STD (female)   . UTI (urinary tract infection)     Patient Active Problem List   Diagnosis Date Noted  . Unspecified asthma(493.90) 05/01/2013  . Rash and nonspecific skin eruption 05/01/2013  . Allergic rhinitis 05/01/2013  . Hypokalemia 04/03/2013  . Acute respiratory failure with hypoxia ([place]) 04/03/2013    Past Surgical History:  Procedure Laterality Date  . CESAREAN SECTION    . THERAPEUTIC ABORTION       OB History    Gravida  4   Para  3   Term  3   Preterm  0   AB  1   Living  3     SAB  0   TAB  1   Ectopic  0   Multiple  0   Live Births               Home Medications    Prior to Admission medications   Medication Sig Start Date End Date Taking? Authorizing Provider  acetaminophen (TYLENOL) 500 MG tablet Take 1,000 mg by mouth every 4 (four) hours as needed for mild pain or headache.    [provider]  albuterol (PROVENTIL HFA;VENTOLIN HFA) 108 (90 Base) MCG/ACT inhaler Inhale  1-2 puffs into the lungs every 6 (six) hours as needed for wheezing or shortness of breath. 08/26/17   Fransico Meadow, PA-C  beclomethasone (QVAR) 80 MCG/ACT inhaler Inhale 1 puff into the lungs 2 (two) times daily. 05/01/13   Juanito Doom, MD  benzonatate (TESSALON) 100 MG capsule Take 1 capsule (100 mg total) by mouth every 8 (eight) hours. 11/15/15   Marella Chimes, PA-C  Diphenhydramine-PE-APAP (MUCINEX FAST-MAX COLD FLU NGHT) 12.5-5-325 MG/10ML LIQD Take 20 mLs by mouth every 6 (six) hours as needed (cough congestion).     [provider]  ondansetron (ZOFRAN ODT) 4 MG disintegrating tablet Take 1 tablet (4 mg total) by mouth every 8 (eight) hours as needed for nausea. 11/15/15   Marella Chimes, PA-C  predniSONE (DELTASONE) 10 MG tablet 6,5,4,3,2,1 taper 08/26/17   Fransico Meadow, PA-C    Family History Family History  Problem Relation Age of Onset  . Heart disease Maternal Grandmother   . Hypertension Maternal Grandmother   . Hypertension Mother   . Diabetes Mother   . Hyperlipidemia Mother   . Heart disease Mother   . Stroke Mother   . Diabetes Paternal Grandmother  Social History Social History   Tobacco Use  . Smoking status: Never Smoker  . Smokeless tobacco: Never Used  Substance Use Topics  . Alcohol use: Yes    Comment: socially  . Drug use: No     Allergies   Penicillins and Flagyl [metronidazole]   Review of Systems Review of Systems  Constitutional: Negative for chills and fever.  HENT: Negative for congestion, ear pain and sore throat.   Eyes: Positive for pain, discharge, redness and itching. Negative for photophobia and visual disturbance.  Respiratory: Negative for cough and shortness of breath.   Cardiovascular: Negative for chest pain.  Neurological: Negative for dizziness, weakness and numbness.     Physical Exam Updated Vital Signs BP 104/76   Pulse 86   Temp 98 F (36.7 C) (Oral)   Resp 18   Ht 5\' 3"  (1.6 m)    Wt 53.1 kg   SpO2 100%   BMI 20.73 kg/m   Physical Exam Vitals signs and nursing note reviewed.  Constitutional:      General: She is not in acute distress.    Appearance: She is well-developed.  HENT:     Head: Normocephalic and atraumatic.     Right Ear: Tympanic membrane normal.     Left Ear: Tympanic membrane normal.     Nose: Nose normal.     Mouth/Throat:     Mouth: Mucous membranes are moist.  Eyes:     General: Lids are everted, no foreign bodies appreciated. Vision grossly intact.        Right eye: No foreign body, discharge or hordeolum.        Left eye: No foreign body, discharge or hordeolum.     Extraocular Movements: Extraocular movements intact.     Conjunctiva/sclera:     Right eye: Right conjunctiva is injected (mild).     Left eye: Left conjunctiva is injected (mild).     Pupils: Pupils are equal, round, and reactive to light.     Comments: No periorbital erythema, edema, or tenderness to palpation. Visual Acuity:  Bilateral Distance: 20/20 R Distance: 20/25 (Pt. states wearing reading glasses. Nurse aware. ) L Distance: 20/20 Fluorescein stain: No uptake, no evidence of abrasion/ulceration.  No appreciable hyphema or hypopyon.  No dendritic staining.  Neck:     Musculoskeletal: Normal range of motion. No neck rigidity.  Cardiovascular:     Rate and Rhythm: Normal rate.  Pulmonary:     Effort: Pulmonary effort is normal.  Lymphadenopathy:     Cervical: No cervical adenopathy.  Neurological:     Mental Status: She is alert.     Comments: Clear speech.   Psychiatric:        Behavior: Behavior normal.        Thought Content: Thought content normal.     ED Treatments / Results  Labs (all labs ordered are listed, but only abnormal results are displayed) Labs Reviewed - No data to display  EKG None  Radiology No results found.  Procedures Procedures (including critical care time)  Medications Ordered in ED Medications  fluorescein  ophthalmic strip 1 strip (has no administration in time range)  tetracaine (PONTOCAINE) 0.5 % ophthalmic solution 2 drop (has no administration in time range)  acetaminophen (TYLENOL) tablet 650 mg (has no administration in time range)     Initial Impression / Assessment and Plan / ED Course  I have reviewed the triage vital signs and the nursing notes.  Pertinent labs & imaging results  that were available during my care of the patient were reviewed by me and considered in my medical decision making (see chart for details).   Patient presents to the ED with signs/sxs consistent with conjunctivitis. There is no fluorescein uptake on exam, no indication of corneal abrasion/ulceration or HSV. Patient is afebrile and without proptosis, entrapment, or consensual photophobia, no periorbital swelling- doubt periorbital or orbital cellulitis.  No significant visual acuity deficit. Given she is having discharge in the AM and sxs for several days will cover with abx ointment. Not a contact lens wearer. .I discussed treatment plan, need for follow-up w/ PCP, and return precautions with the patient. Provided opportunity for questions, patient confirmed understanding and is in agreement with plan.   Final Clinical Impressions(s) / ED Diagnoses   Final diagnoses:  Acute conjunctivitis of both eyes, unspecified acute conjunctivitis type    ED Discharge Orders         Ordered    erythromycin ophthalmic ointment     11/02/18 1228           Monterrius Cardosa, Glynda Jaeger, PA-C 11/02/18 1230    Little, Wenda Overland, MD 11/02/18 [phone]

## 2018-11-02 NOTE — Discharge Instructions (Addendum)
You were seen in the ER today for eye irritation.  We suspect you have conjunctivitis.  We are treating this with erythromycin ointment, this is an antibiotic ointment to treat possible bacterial infection.  We have prescribed you new medication(s) today. Discuss the medications prescribed today with your pharmacist as they can have adverse effects and interactions with your other medicines including over the counter and prescribed medications. Seek medical evaluation if you start to experience new or abnormal symptoms after taking one of these medicines, seek care immediately if you start to experience difficulty breathing, feeling of your throat closing, facial swelling, or rash as these could be indications of a more serious allergic reaction  Follow-up with primary care within 3 days for reevaluation.  Return to the ER for new or worsening symptoms including but not limited to change in vision, fever, redness/swelling around the eye, trouble moving your eye, or any other concerns.

## 2018-11-18 ENCOUNTER — Telehealth: Payer: Self-pay | Admitting: Radiation Oncology

## 2018-11-18 NOTE — Telephone Encounter (Signed)
Received FMLA paperwork for this patient intended for Kennith Maes, PA in email. Place paperwork in inner office mail to her attention.

## 2019-01-23 DIAGNOSIS — B351 Tinea unguium: Secondary | ICD-10-CM | POA: Insufficient documentation

## 2019-02-22 IMAGING — CR DG CHEST 2V
2 series · 2 of 2 positions shown · non-contrast
Comparison: Radiographs November 15, 2015.

CLINICAL DATA: Cough.

EXAM:
CHEST  2 VIEW

[w chest pa]
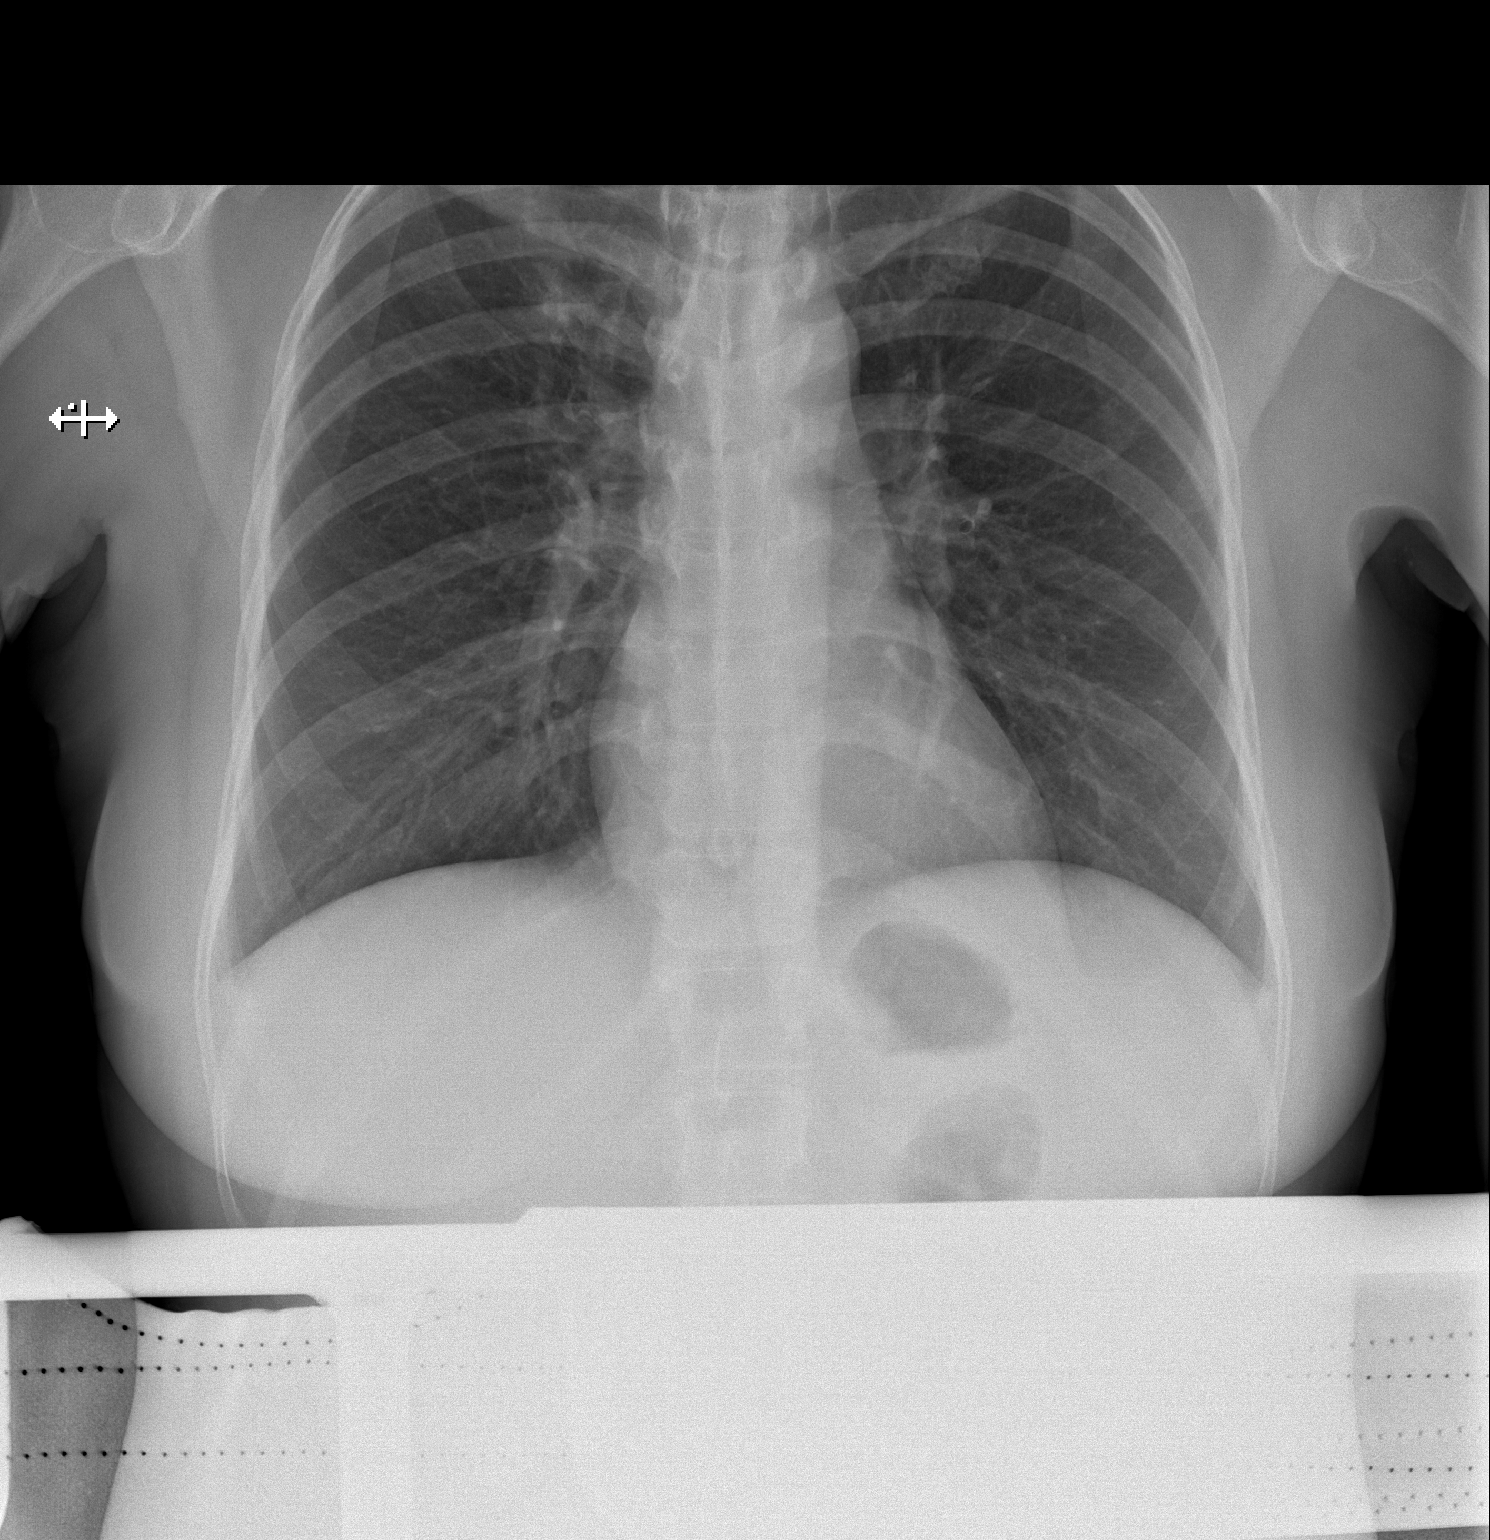

[w chest lat]
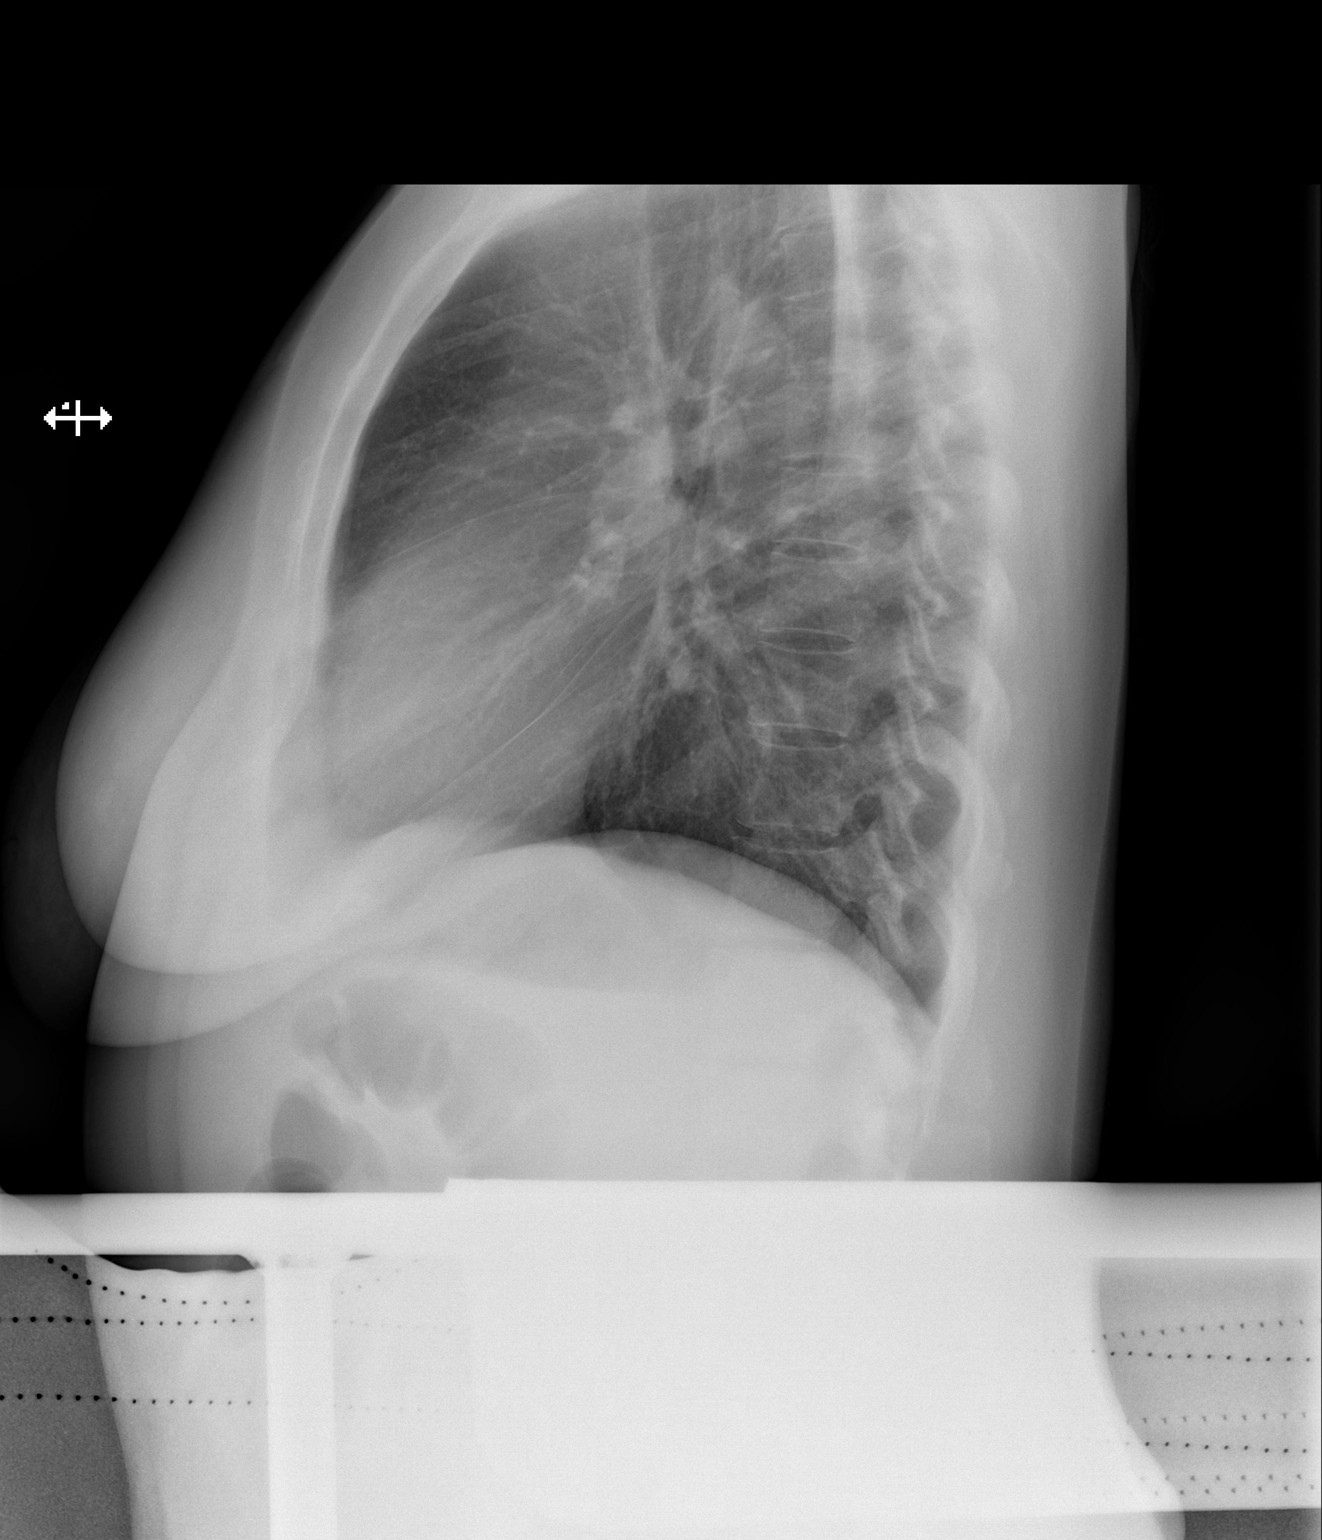

[2 of 2 positions shown; findings below may reference images not displayed]

FINDINGS: The heart size and mediastinal contours are within normal limits.
Both lungs are clear. No pneumothorax or pleural effusion is noted.
The visualized skeletal structures are unremarkable.
IMPRESSION: No active cardiopulmonary disease.

## 2019-09-15 ENCOUNTER — Encounter (HOSPITAL_COMMUNITY): Payer: Self-pay | Admitting: Emergency Medicine

## 2019-09-15 ENCOUNTER — Other Ambulatory Visit: Payer: Self-pay

## 2019-09-15 ENCOUNTER — Observation Stay (HOSPITAL_COMMUNITY)
Admission: EM | Admit: 2019-09-15 | Discharge: 2019-09-16 | Disposition: A | Discharge status: Home / Self Care | Payer: Self-pay | Attending: Internal Medicine | Admitting: Internal Medicine

## 2019-09-15 DIAGNOSIS — K219 Gastro-esophageal reflux disease without esophagitis: Secondary | ICD-10-CM | POA: Insufficient documentation

## 2019-09-15 DIAGNOSIS — B029 Zoster without complications: Secondary | ICD-10-CM | POA: Diagnosis present

## 2019-09-15 DIAGNOSIS — Z881 Allergy status to other antibiotic agents status: Secondary | ICD-10-CM | POA: Insufficient documentation

## 2019-09-15 DIAGNOSIS — Z79899 Other long term (current) drug therapy: Secondary | ICD-10-CM | POA: Insufficient documentation

## 2019-09-15 DIAGNOSIS — Z20822 Contact with and (suspected) exposure to covid-19: Secondary | ICD-10-CM | POA: Insufficient documentation

## 2019-09-15 DIAGNOSIS — J45909 Unspecified asthma, uncomplicated: Secondary | ICD-10-CM | POA: Insufficient documentation

## 2019-09-15 DIAGNOSIS — B20 Human immunodeficiency virus [HIV] disease: Secondary | ICD-10-CM | POA: Insufficient documentation

## 2019-09-15 DIAGNOSIS — Z880 Allergy status to penicillin: Secondary | ICD-10-CM | POA: Insufficient documentation

## 2019-09-15 DIAGNOSIS — B0230 Zoster ocular disease, unspecified: Principal | ICD-10-CM | POA: Insufficient documentation

## 2019-09-15 DIAGNOSIS — Z882 Allergy status to sulfonamides status: Secondary | ICD-10-CM | POA: Insufficient documentation

## 2019-09-15 DIAGNOSIS — Z21 Asymptomatic human immunodeficiency virus [HIV] infection status: Secondary | ICD-10-CM | POA: Diagnosis present

## 2019-09-15 DIAGNOSIS — Z87892 Personal history of anaphylaxis: Secondary | ICD-10-CM | POA: Insufficient documentation

## 2019-09-15 DIAGNOSIS — B028 Zoster with other complications: Secondary | ICD-10-CM

## 2019-09-15 HISTORY — DX: Asymptomatic human immunodeficiency virus (hiv) infection status: Z21

## 2019-09-15 HISTORY — DX: Human immunodeficiency virus (HIV) disease: B20

## 2019-09-15 LAB — CBC WITH DIFFERENTIAL/PLATELET
Abs Immature Granulocytes: 0.02 10*3/uL (ref 0.00–0.07)
Basophils Absolute: 0 10*3/uL (ref 0.0–0.1)
Basophils Relative: 0 %
Eosinophils Absolute: 0.2 10*3/uL (ref 0.0–0.5)
Eosinophils Relative: 3 %
HCT: 35.9 % — ABNORMAL LOW (ref 36.0–46.0)
Hemoglobin: 11.7 g/dL — ABNORMAL LOW (ref 12.0–15.0)
Immature Granulocytes: 0 %
Lymphocytes Relative: 34 %
Lymphs Abs: 1.6 10*3/uL (ref 0.7–4.0)
MCH: 29.3 pg (ref 26.0–34.0)
MCHC: 32.6 g/dL (ref 30.0–36.0)
MCV: 89.8 fL (ref 80.0–100.0)
Monocytes Absolute: 0.4 10*3/uL (ref 0.1–1.0)
Monocytes Relative: 8 %
Neutro Abs: 2.7 10*3/uL (ref 1.7–7.7)
Neutrophils Relative %: 55 %
Platelets: 245 10*3/uL (ref 150–400)
RBC: 4 MIL/uL (ref 3.87–5.11)
RDW: 14.1 % (ref 11.5–15.5)
WBC: 4.9 10*3/uL (ref 4.0–10.5)
nRBC: 0 % (ref 0.0–0.2)

## 2019-09-15 LAB — BASIC METABOLIC PANEL
Anion gap: 9 (ref 5–15)
BUN: 15 mg/dL (ref 6–20)
CO2: 23 mmol/L (ref 22–32)
Calcium: 9.1 mg/dL (ref 8.9–10.3)
Chloride: 107 mmol/L (ref 98–111)
Creatinine, Ser: 0.69 mg/dL (ref 0.44–1.00)
GFR calc Af Amer: >60 mL/min (ref >60)
GFR calc non Af Amer: >60 mL/min (ref >60)
Glucose, Bld: 91 mg/dL (ref 70–99)
Potassium: 3.8 mmol/L (ref 3.5–5.1)
Sodium: 139 mmol/L (ref 135–145)

## 2019-09-15 LAB — HCG, QUANTITATIVE, PREGNANCY: hCG, Beta Chain, Quant, S: <1 m[IU]/mL (ref <5)

## 2019-09-15 MED ORDER — DARUN-COBIC-EMTRICIT-TENOFAF 800-150-200-10 MG PO TABS
1.0000 | ORAL_TABLET | Freq: Every day | ORAL | Status: DC
  Filled 2019-09-15: qty 1

## 2019-09-15 MED ORDER — ACETAMINOPHEN 650 MG RE SUPP: 650.0000 mg | Freq: Four times a day (QID) | RECTAL | Status: DC | PRN

## 2019-09-15 MED ORDER — SODIUM CHLORIDE 0.9 % IV SOLN: INTRAVENOUS | Status: AC

## 2019-09-15 MED ORDER — DAPSONE 100 MG PO TABS
100.0000 mg | ORAL_TABLET | Freq: Every day | ORAL | Status: DC
  Filled 2019-09-15: qty 1

## 2019-09-15 MED ORDER — HYDROCODONE-ACETAMINOPHEN 5-325 MG PO TABS
2.0000 | ORAL_TABLET | Freq: Once | ORAL | Status: AC
  Administered 2019-09-15: 2 via ORAL
  Filled 2019-09-15: qty 2

## 2019-09-15 MED ORDER — ENOXAPARIN SODIUM 40 MG/0.4ML ~~LOC~~ SOLN: 40.0000 mg | Freq: Every day | SUBCUTANEOUS | Status: DC

## 2019-09-15 MED ORDER — DEXTROSE 5 % IV SOLN
700.0000 mg | Freq: Three times a day (TID) | INTRAVENOUS | Status: DC
  Administered 2019-09-15 – 2019-09-16 (×3): 700 mg via INTRAVENOUS
  Filled 2019-09-15 (×4): qty 14

## 2019-09-15 MED ORDER — ACETAMINOPHEN 325 MG PO TABS
650.0000 mg | ORAL_TABLET | Freq: Four times a day (QID) | ORAL | Status: DC | PRN
  Administered 2019-09-16 (×2): 650 mg via ORAL
  Filled 2019-09-15 (×2): qty 2

## 2019-09-15 NOTE — Progress Notes (Signed)
Pharmacy Antibiotic Note  Kristin Mason is a 49 y.o. female admitted on 09/15/2019 with vesicular rash above R eye and brow .  Pharmacy has been consulted for acyclovir dosing for herpes zoster/shingles.  Plan: Acyclovir 10 mg/kg IV q8h- IV requested F/u renal fxn  Height: 5\' 3"  (160 cm) Weight: 156 lb (70.8 kg) IBW/kg (Calculated) : 52.4  Temp (24hrs), Avg:98.3 F (36.8 C), Min:98.3 F (36.8 C), Max:98.3 F (36.8 C)  No results for input(s): WBC, CREATININE, LATICACIDVEN, VANCOTROUGH, VANCOPEAK, VANCORANDOM, GENTTROUGH, GENTPEAK, GENTRANDOM, TOBRATROUGH, TOBRAPEAK, TOBRARND, AMIKACINPEAK, AMIKACINTROU, AMIKACIN in the last 168 hours.  CrCl cannot be calculated (Patient's most recent lab result is older than the maximum 21 days allowed.).    Allergies  Allergen Reactions  . Penicillins Anaphylaxis, Shortness Of Breath and Swelling    Has patient had a PCN reaction causing immediate rash, facial/tongue/throat swelling, SOB or lightheadedness with hypotension: Yes Has patient had a PCN reaction causing severe rash involving mucus membranes or skin necrosis: No Has patient had a PCN reaction that required hospitalization Yes Has patient had a PCN reaction occurring within the last 10 years: No If all of the above answers are "NO", then may proceed with Cephalosporin use.   . Sulfamethoxazole Anaphylaxis    Pt state turns feet blue   . Flagyl [Metronidazole] Swelling    Made feet blue and numb    Thank you for allowing pharmacy to be a part of this patient's care.  Eudelia Bunch, Pharm.D [phone] 09/15/2019 9:33 PM

## 2019-09-15 NOTE — ED Triage Notes (Signed)
Patient complaining of rash over right eye. Patient states she had shingles before and wants to make sure that is not what it is.

## 2019-09-15 NOTE — H&P (Signed)
[facility] H&P    Patient Demographics:    Kristin Mason, is a 49 y.o. female  MRN: [id number]  DOB - 05/27/1971  Admit Date - 09/15/2019  Referring MD/NP/PA:  Nuala Alpha  Outpatient Primary MD for the patient is System, Pcp Not In  Patient coming from:  home  Chief complaint- shingles   HPI:    Kristin Mason  is a 49 y.o. female, w Jerrye Bushy, h/o esophageal candidiasis,  HIV has c/o vesicular rash since Tuesday over the right eye.  Pt notes slight pain of scalp prior to the rash.   Pt denies eye pain or vision change.   In ED,  T 98.3, P 94, R 15, Bp 111/76 pox 98% on RA Wt 70.8kg   ED requests that patient be admitted for shingles.     Review of systems:    In addition to the HPI above,  No Fever-chills, No Headache, No changes with Vision or hearing, No problems swallowing food or Liquids, No Chest pain, Cough or Shortness of Breath, No Abdominal pain, No Nausea or Vomiting, bowel movements are regular, No Blood in stool or Urine, No dysuria,   No new joints pains-aches,  No new weakness, tingling, numbness in any extremity, No recent weight gain or loss, No polyuria, polydypsia or polyphagia, No significant Mental Stressors.  All other systems reviewed and are negative.    Past History of the following :    Past Medical History:  Diagnosis Date  . Abnormal Pap smear 98   cryo therapy  . Asthma   . Eczema   . HIV (human immunodeficiency virus infection) ([place])   . STD (female)   . UTI (urinary tract infection)       Past Surgical History:  Procedure Laterality Date  . CESAREAN SECTION    . THERAPEUTIC ABORTION        Social History:      Social History   Tobacco Use  . Smoking status: Never Smoker  . Smokeless tobacco: Never Used  Substance Use Topics  . Alcohol use: Yes    Comment: socially       Family History :     Family History  Problem Relation Age  of Onset  . Heart disease Maternal Grandmother   . Hypertension Maternal Grandmother   . Hypertension Mother   . Diabetes Mother   . Hyperlipidemia Mother   . Heart disease Mother   . Stroke Mother   . Diabetes Paternal Grandmother        Home Medications:   Prior to Admission medications   Medication Sig Start Date End Date Taking? Authorizing Provider  acetaminophen (TYLENOL) 325 MG tablet Take 650 mg by mouth every 6 (six) hours as needed for moderate pain or headache.    [provider]  albuterol (PROVENTIL HFA;VENTOLIN HFA) 108 (90 Base) MCG/ACT inhaler Inhale 1-2 puffs into the lungs every 6 (six) hours as needed for wheezing or shortness of breath. Patient not taking: Reported on 11/02/2018 08/26/17   Fransico Meadow, PA-C  beclomethasone (  QVAR) 80 MCG/ACT inhaler Inhale 1 puff into the lungs 2 (two) times daily. Patient not taking: Reported on 11/02/2018 05/01/13   Juanito Doom, MD  benzonatate (TESSALON) 100 MG capsule Take 1 capsule (100 mg total) by mouth every 8 (eight) hours. Patient not taking: Reported on 11/02/2018 11/15/15   Marella Chimes, PA-C  diphenhydrAMINE (BENADRYL) 25 mg capsule Take 25 mg by mouth every 6 (six) hours as needed for allergies.    [provider]  erythromycin ophthalmic ointment Place a 1/2 inch ribbon of ointment into the lower eyelid of both eyes 5 times per day for 5-7 days. 11/02/18   Petrucelli, Samantha R, PA-C  ibuprofen (ADVIL,MOTRIN) 200 MG tablet Take 400 mg by mouth every 6 (six) hours as needed for headache or moderate pain.    [provider]  ondansetron (ZOFRAN ODT) 4 MG disintegrating tablet Take 1 tablet (4 mg total) by mouth every 8 (eight) hours as needed for nausea. Patient not taking: Reported on 11/02/2018 11/15/15   Marella Chimes, PA-C  polyvinyl alcohol (LIQUIFILM TEARS) 1.4 % ophthalmic solution Place 1 drop into both eyes as needed for dry eyes (allergies).    [provider]    predniSONE (DELTASONE) 10 MG tablet 6,5,4,3,2,1 taper Patient not taking: Reported on 11/02/2018 08/26/17   Fransico Meadow, PA-C     Allergies:     Allergies  Allergen Reactions  . Penicillins Anaphylaxis, Shortness Of Breath and Swelling    Has patient had a PCN reaction causing immediate rash, facial/tongue/throat swelling, SOB or lightheadedness with hypotension: Yes Has patient had a PCN reaction causing severe rash involving mucus membranes or skin necrosis: No Has patient had a PCN reaction that required hospitalization Yes Has patient had a PCN reaction occurring within the last 10 years: No If all of the above answers are "NO", then may proceed with Cephalosporin use.   . Sulfamethoxazole Anaphylaxis    Pt state turns feet blue   . Flagyl [Metronidazole] Swelling    Made feet blue and numb  . Biktarvy [Bictegravir-Emtricitab-Tenofov] Rash    Bruise/ rash on her left thigh     Physical Exam:   Vitals  Blood pressure 111/76, pulse 94, temperature 98.3 F (36.8 C), temperature source Oral, resp. rate 15, height 5\' 3"  (1.6 m), weight 70.8 kg, last menstrual period 08/24/2019, SpO2 98 %.  1.  General: axoxo3  2. Psychiatric: euthymic  3. Neurologic: nonfocal  4. HEENMT:  Anicteric, pupils 1.76mm symmetric, direct, consensual intact Neck: no jvd  5. Respiratory : CTAB  6. Cardiovascular : rrr s1, s2,   7. Gastrointestinal:  Abd: soft, nt, nd, +bs  8. Skin:  Ext: no c/c/e,  Slight 35mm scabed vesicle just lateral to right eye, and 1 above the right eye, about 5 small ? Vesicles on the right eye lid, and 1 vesicle not scabbed over just above the right eye, close to scalp.    9.Musculoskeletal:  Good ROM    Data Review:    CBC No results for input(s): WBC, HGB, HCT, PLT, MCV, MCH, MCHC, RDW, LYMPHSABS, MONOABS, EOSABS, BASOSABS, BANDABS in the last 168 hours.  Invalid input(s): NEUTRABS,  BANDSABD ------------------------------------------------------------------------------------------------------------------  No results found for this or any previous visit (from the past 40 hour(s)).  Chemistries  No results for input(s): NA, K, CL, CO2, GLUCOSE, BUN, CREATININE, CALCIUM, MG, AST, ALT, ALKPHOS, BILITOT in the last 168 hours.  Invalid input(s): GFRCGP ------------------------------------------------------------------------------------------------------------------  ------------------------------------------------------------------------------------------------------------------ GFR: CrCl cannot be calculated (  Patient's most recent lab result is older than the maximum 21 days allowed.). Liver Function Tests: No results for input(s): AST, ALT, ALKPHOS, BILITOT, PROT, ALBUMIN in the last 168 hours. No results for input(s): LIPASE, AMYLASE in the last 168 hours. No results for input(s): AMMONIA in the last 168 hours. Coagulation Profile: No results for input(s): INR, PROTIME in the last 168 hours. Cardiac Enzymes: No results for input(s): CKTOTAL, CKMB, CKMBINDEX, TROPONINI in the last 168 hours. BNP (last 3 results) No results for input(s): PROBNP in the last 8760 hours. HbA1C: No results for input(s): HGBA1C in the last 72 hours. CBG: No results for input(s): GLUCAP in the last 168 hours. Lipid Profile: No results for input(s): CHOL, HDL, LDLCALC, TRIG, CHOLHDL, LDLDIRECT in the last 72 hours. Thyroid Function Tests: No results for input(s): TSH, T4TOTAL, FREET4, T3FREE, THYROIDAB in the last 72 hours. Anemia Panel: No results for input(s): VITAMINB12, FOLATE, FERRITIN, TIBC, IRON, RETICCTPCT in the last 72 hours.  --------------------------------------------------------------------------------------------------------------- Urine analysis:    Component Value Date/Time   COLORURINE YELLOW 11/15/2015 [place] 11/15/2015 1755   LABSPEC 1.008  11/15/2015 1755   PHURINE 7.0 11/15/2015 1755   GLUCOSEU NEGATIVE 11/15/2015 1755   HGBUR NEGATIVE 11/15/2015 1755   BILIRUBINUR NEGATIVE 11/15/2015 1755   KETONESUR NEGATIVE 11/15/2015 1755   PROTEINUR NEGATIVE 11/15/2015 1755   UROBILINOGEN 0.2 08/08/2013 1327   NITRITE NEGATIVE 11/15/2015 1755   LEUKOCYTESUR NEGATIVE 11/15/2015 1755      Imaging Results:    No results found.     Assessment & Plan:    Principal Problem:   Shingles Active Problems:   HIV (human immunodeficiency virus infection) ([place])  Shingles  Acyclovir iv pharmacy to dose Please discuss with opthamology in Am Consider discussion with ID after ruling out herpes opthalmicus regarding treatment plan  HIV Cont Symtuza 1 po qday if we have it.  Cont Dapsone 100mg  po qday  Gerd Cont PPI   DVT Prophylaxis-   Lovenox - SCDs   AM Labs Ordered, also please review Full Orders  Family Communication: Admission, patients condition and plan of care including tests being ordered have been discussed with the patient  who indicate understanding and agree with the plan and Code Status.  Code Status:  FULL CODE per patient  Admission status: Observation: Based on patients clinical presentation and evaluation of above clinical data, I have made determination that patient meets observation criteria at this time.   Time spent in minutes : 55 minutes   Jani Gravel M.D on 09/15/2019 at 9:47 PM

## 2019-09-15 NOTE — Progress Notes (Signed)
Patient ID: Kristin Mason, female   DOB: 1971-02-18, 49 y.o.   MRN: [id number]  Spoke to PA Annamaria Helling in [place] re: Ms Bridge a 49 y,o F with hx of HIV/AIDS with 3 day history of vesicular appearing rash above right eye and brow.  Reviewed pics in chart.  Patient with no current eye or vision complaints.    Rec:   -- Agree with PA Morellis impression that V1 VZV is provbable.   I would treat with PO antiviral (eg, Valcyclovir 1 g PO TID x 10 days).  Also baci oph ung BID to skin lesions.   -- Please ask the patient to be seen Monday at [place] [phone]) @ 830am for full eye exam, or to call me through the clinic on call line any time this weekend or after hours for any new vision or eye complaints.    Will remain available for any needs.

## 2019-09-15 NOTE — ED Provider Notes (Signed)
[facility] DEPT Provider Note   CSN: [id number] Arrival date & time: 09/15/19  1904     History Chief Complaint  Patient presents with  . Rash    Kristin Mason is a 49 y.o. female history of HIV, eczema, asthma.  Patient presents today for rash of the right forehead and right eye onset Tuesday morning 09/12/2019.  She reports rash initially began at her hairline, small erythematous areas that have gradually extended down towards her right eyebrow.  She noticed yesterday that the areas begin to bubble and crust over.  She reports that she has had shingles in the past and this looks and feels very similar today.  She describes pain as a burning constant worsened with palpation moderate in intensity no alleviating factors or radiation of pain.  She reports that she has noticed some right eyelid swelling over the past day.  She denies any eye pain or difficulty seeing.  She called her infectious disease specialist who advised her to come to the ER for evaluation as she may possibly need IV antiviral medications.  Of note patient reports that she has been out of her HIV medication for several weeks and was recently informed that her CD4 count was "very low".  She denies fever/chills, vision changes, neck stiffness, tinnitus, ear pain, trismus, neck pain, chest pain/shortness of breath, abdominal pain, nausea/vomiting or any additional concerns.  HPI     Past Medical History:  Diagnosis Date  . Abnormal Pap smear 98   cryo therapy  . Asthma   . Eczema   . HIV (human immunodeficiency virus infection) ([facility])   . STD (female)   . UTI (urinary tract infection)     Patient Active Problem List   Diagnosis Date Noted  . Shingles 09/15/2019  . HIV (human immunodeficiency virus infection) ([place]) 09/15/2019  . Unspecified asthma(493.90) 05/01/2013  . Rash and nonspecific skin eruption 05/01/2013  . Allergic rhinitis 05/01/2013  . Hypokalemia 04/03/2013    . Acute respiratory failure with hypoxia ([place]) 04/03/2013    Past Surgical History:  Procedure Laterality Date  . CESAREAN SECTION    . THERAPEUTIC ABORTION       OB History    Gravida  4   Para  3   Term  3   Preterm  0   AB  1   Living  3     SAB  0   TAB  1   Ectopic  0   Multiple  0   Live Births              Family History  Problem Relation Age of Onset  . Heart disease Maternal Grandmother   . Hypertension Maternal Grandmother   . Hypertension Mother   . Diabetes Mother   . Hyperlipidemia Mother   . Heart disease Mother   . Stroke Mother   . Diabetes Paternal Grandmother     Social History   Tobacco Use  . Smoking status: Never Smoker  . Smokeless tobacco: Never Used  Substance Use Topics  . Alcohol use: Yes    Comment: socially  . Drug use: No    Home Medications Prior to Admission medications   Medication Sig Start Date End Date Taking? Authorizing Provider  acetaminophen (TYLENOL) 325 MG tablet Take 650 mg by mouth every 6 (six) hours as needed for moderate pain or headache.    [provider]  albuterol (PROVENTIL HFA;VENTOLIN HFA) 108 (90 Base) MCG/ACT inhaler Inhale  1-2 puffs into the lungs every 6 (six) hours as needed for wheezing or shortness of breath. Patient not taking: Reported on 11/02/2018 08/26/17   Fransico Meadow, PA-C  beclomethasone (QVAR) 80 MCG/ACT inhaler Inhale 1 puff into the lungs 2 (two) times daily. Patient not taking: Reported on 11/02/2018 05/01/13   Juanito Doom, MD  benzonatate (TESSALON) 100 MG capsule Take 1 capsule (100 mg total) by mouth every 8 (eight) hours. Patient not taking: Reported on 11/02/2018 11/15/15   Marella Chimes, PA-C  BIKTARVY 50-200-25 MG TABS tablet Take 1 tablet by mouth daily. 07/11/18   [provider]  diphenhydrAMINE (BENADRYL) 25 mg capsule Take 25 mg by mouth every 6 (six) hours as needed for allergies.    [provider]  erythromycin  ophthalmic ointment Place a 1/2 inch ribbon of ointment into the lower eyelid of both eyes 5 times per day for 5-7 days. 11/02/18   Petrucelli, Samantha R, PA-C  ibuprofen (ADVIL,MOTRIN) 200 MG tablet Take 400 mg by mouth every 6 (six) hours as needed for headache or moderate pain.    [provider]  ondansetron (ZOFRAN ODT) 4 MG disintegrating tablet Take 1 tablet (4 mg total) by mouth every 8 (eight) hours as needed for nausea. Patient not taking: Reported on 11/02/2018 11/15/15   Marella Chimes, PA-C  polyvinyl alcohol (LIQUIFILM TEARS) 1.4 % ophthalmic solution Place 1 drop into both eyes as needed for dry eyes (allergies).    [provider]  predniSONE (DELTASONE) 10 MG tablet 6,5,4,3,2,1 taper Patient not taking: Reported on 11/02/2018 08/26/17   Fransico Meadow, PA-C    Allergies    Penicillins, Sulfamethoxazole, and Flagyl [metronidazole]  Review of Systems   Review of Systems Ten systems are reviewed and are negative for acute change except as noted in the HPI  Physical Exam Updated Vital Signs BP 111/76 (BP Location: Right Arm)   Pulse 94   Temp 98.3 F (36.8 C) (Oral)   Resp 15   Ht 5\' 3"  (1.6 m)   Wt 70.8 kg   LMP 08/24/2019   SpO2 98%   BMI 27.63 kg/m   Physical Exam Constitutional:      General: She is not in acute distress.    Appearance: Normal appearance. She is well-developed. She is not ill-appearing or diaphoretic.  HENT:     Head: Normocephalic and atraumatic.     Jaw: There is normal jaw occlusion. No trismus.     Right Ear: Tympanic membrane and external ear normal.     Left Ear: Tympanic membrane and external ear normal.     Nose: Nose normal.     Mouth/Throat:     Mouth: Mucous membranes are moist.     Pharynx: Oropharynx is clear.  Eyes:     General: Vision grossly intact. Gaze aligned appropriately.     Extraocular Movements: Extraocular movements intact.     Conjunctiva/sclera: Conjunctivae normal.     Pupils: Pupils are  equal, round, and reactive to light.     Comments: No pain with extraocular motions.  Neck:     Trachea: Trachea and phonation normal. No tracheal deviation.     Meningeal: Brudzinski's sign absent.  Pulmonary:     Effort: Pulmonary effort is normal. No respiratory distress.  Abdominal:     General: There is no distension.     Palpations: Abdomen is soft.     Tenderness: There is no abdominal tenderness. There is no guarding or rebound.  Musculoskeletal:        General: Normal range of motion.     Cervical back: Normal range of motion and neck supple.  Skin:    General: Skin is warm and dry.          Comments: Vesicular rash of the right forehead extending down to the right eyebrow and right upper eyelid with mild swelling.  See picture below.  Neurological:     Mental Status: She is alert.     GCS: GCS eye subscore is 4. GCS verbal subscore is 5. GCS motor subscore is 6.     Comments: Speech is clear and goal oriented, follows commands Major Cranial nerves without deficit, no facial droop Moves extremities without ataxia, coordination intact  Psychiatric:        Behavior: Behavior normal.       ED Results / Procedures / Treatments   Labs (all labs ordered are listed, but only abnormal results are displayed) Labs Reviewed  SARS CORONAVIRUS 2 (TAT 6-24 HRS)  CBC WITH DIFFERENTIAL/PLATELET  BASIC METABOLIC PANEL  HCG, QUANTITATIVE, PREGNANCY  COMPREHENSIVE METABOLIC PANEL  CBC    EKG None  Radiology No results found.  Procedures Procedures (including critical care time)  Medications Ordered in ED Medications  enoxaparin (LOVENOX) injection 40 mg (has no administration in time range)  acetaminophen (TYLENOL) tablet 650 mg (has no administration in time range)    Or  acetaminophen (TYLENOL) suppository 650 mg (has no administration in time range)  0.9 %  sodium chloride infusion (has no administration in time range)  HYDROcodone-acetaminophen (NORCO/VICODIN)  5-325 MG per tablet 2 tablet (2 tablets Oral Given 09/15/19 2032)    ED Course  I have reviewed the triage vital signs and the nursing notes.  Pertinent labs & imaging results that were available during my care of the patient were reviewed by me and considered in my medical decision making (see chart for details).  Clinical Course as of Sep 14 2126  Fri Sep 15, 2019  2042 Dr. Prudencio Burly [phone])   [BM]    Clinical Course User Index [BM] Deliah Boston, PA-C    Infectious disease office visit note 09/04/2019 [facility]  Date Value Ref Range Status  01/23/2019 0.05 (L) 0.31 - 3.11 X1000/uL Final   [facility] (09/04/2019): 0.10 (L)   Reference 0.31-3.11 x1000uL % [facility] (09/04/2019): 7.2 (L) Reference 31-61% MDM Rules/Calculators/A&P                     49 year old female with history of HIV and recent blood work performed through infectious disease specialist concerning for AIDS presents today for shingles rash of the right forehead extending down to the right upper eyelid.  She has no visual complaints today.  She has no history of fever or other concerns at this time.  Overall she is well-appearing and in no acute distress.  Infectious disease NP telephone note reviewed shows concern that she may need admission.  I discussed case with on-call ophthalmology Dr. Prudencio Burly, advises that from an ophthalmology standpoint she does not need an emergent eye exam, if this was an outpatient visit he would recommend valacyclovir and bacitracin ophthalmogic ointment for the blister closest to the lateral canthus, also advises to follow infectious disease recommendations.  I then reexamined the patient she is concerned for worsening of her rash as it continues to extend down her face.There is concern that due to patient HIV/AIDS status her symptoms will worsen and feel she  will benefit from IV antivirals at this time. I consulted hospitalist Dr. Maudie Mercury who has accepted patient for  observation admission.  Routine admission blood work pending at this time. Case discussed with attending physician Dr. Johnney Killian who agrees with admission at this time. - CBC nonacute BMP within normal limits  Patient has been admitted to hospitalist service.  Note: Portions of this report may have been transcribed using voice recognition software. Every effort was made to ensure accuracy; however, inadvertent computerized transcription errors may still be present. Final Clinical Impression(s) / ED Diagnoses Final diagnoses:  Herpes zoster with complication    Rx / DC Orders ED Discharge Orders    None       Gari Crown 09/15/19 2215    Charlesetta Shanks, MD 09/18/19 [phone]

## 2019-09-16 DIAGNOSIS — B0230 Zoster ocular disease, unspecified: Secondary | ICD-10-CM

## 2019-09-16 LAB — COMPREHENSIVE METABOLIC PANEL
ALT: 13 U/L (ref 0–44)
AST: 15 U/L (ref 15–41)
Albumin: 3.8 g/dL (ref 3.5–5.0)
Alkaline Phosphatase: 51 U/L (ref 38–126)
Anion gap: 5 (ref 5–15)
BUN: 13 mg/dL (ref 6–20)
CO2: 25 mmol/L (ref 22–32)
Calcium: 8.9 mg/dL (ref 8.9–10.3)
Chloride: 106 mmol/L (ref 98–111)
Creatinine, Ser: 0.72 mg/dL (ref 0.44–1.00)
GFR calc Af Amer: >60 mL/min (ref >60)
GFR calc non Af Amer: >60 mL/min (ref >60)
Glucose, Bld: 87 mg/dL (ref 70–99)
Potassium: 3.3 mmol/L — ABNORMAL LOW (ref 3.5–5.1)
Sodium: 136 mmol/L (ref 135–145)
Total Bilirubin: 0.9 mg/dL (ref 0.3–1.2)
Total Protein: 8.5 g/dL — ABNORMAL HIGH (ref 6.5–8.1)

## 2019-09-16 LAB — CBC
HCT: 32.6 % — ABNORMAL LOW (ref 36.0–46.0)
Hemoglobin: 10.5 g/dL — ABNORMAL LOW (ref 12.0–15.0)
MCH: 29 pg (ref 26.0–34.0)
MCHC: 32.2 g/dL (ref 30.0–36.0)
MCV: 90.1 fL (ref 80.0–100.0)
Platelets: 210 10*3/uL (ref 150–400)
RBC: 3.62 MIL/uL — ABNORMAL LOW (ref 3.87–5.11)
RDW: 14.3 % (ref 11.5–15.5)
WBC: 4.2 10*3/uL (ref 4.0–10.5)
nRBC: 0 % (ref 0.0–0.2)

## 2019-09-16 LAB — SARS CORONAVIRUS 2 (TAT 6-24 HRS): SARS Coronavirus 2: NEGATIVE

## 2019-09-16 MED ORDER — POTASSIUM CHLORIDE CRYS ER 20 MEQ PO TBCR
20.0000 meq | EXTENDED_RELEASE_TABLET | Freq: Once | ORAL | Status: AC
  Administered 2019-09-16: 20 meq via ORAL
  Filled 2019-09-16: qty 1

## 2019-09-16 MED ORDER — BACITRACIN-POLYMYXIN B 500-10000 UNIT/GM OP OINT: 1.0000 "application " | TOPICAL_OINTMENT | Freq: Two times a day (BID) | OPHTHALMIC | 0 refills | Status: DC

## 2019-09-16 MED ORDER — VALACYCLOVIR HCL 1 G PO TABS: 1000.0000 mg | ORAL_TABLET | Freq: Three times a day (TID) | ORAL | 0 refills | Status: AC

## 2019-09-16 MED ORDER — SODIUM CHLORIDE 0.9 % IV SOLN: INTRAVENOUS | Status: DC

## 2019-09-16 MED ORDER — DIPHENHYDRAMINE HCL 25 MG PO CAPS: 25.0000 mg | ORAL_CAPSULE | Freq: Four times a day (QID) | ORAL | Status: DC | PRN

## 2019-09-16 NOTE — Discharge Summary (Signed)
Physician Discharge Summary  Kristin Mason [id number] DOB: [birth year] DOA: 09/15/2019  PCP: System, Pcp Not In  Admit date: 09/15/2019 Discharge date: 09/16/2019   Code Status: Full Code  Admitted From: Home Discharged to: Home Discharge Condition: Stable  Recommendations for Outpatient Follow-up   1. To follow-up with ophthalmology at 8:30 AM on Monday, Dr. Prudencio Burly aware 2. Please evaluate patient for clearance to return to work when out of contagious window  Hospital Summary  This is a 49 year old female with history of GERD, esophageal candidiasis, HIV follows at [facility] who presented to the ED on 2/5 with right facial vesicular rash since Tuesday as well as right-sided headache concerning for herpes ophthalmicus.  She was started on IV acyclovir and IV fluids.  On evaluation in a.m. she did not seem to have any involvement of her eye or tip of nose or ear.  This was discussed with ophthalmology over the phone who recommended patient discharged with valacyclovir 1 g 3 times daily x10 days as well as bacitracin ointment twice daily and with close follow-up this Monday morning.  A & P   Principal Problem:   Shingles Active Problems:   HIV (human immunodeficiency virus infection) ([place])   1. Herpes zoster ophthalmicus 1. No ocular involvement, no involvement of tip of nose, not concerning for Ramsay Hunt syndrome, does have vesicular rash over right eyelid and temple 2. Tolerated 2 doses acyclovir with IV fluids and no concern for renal impairment 3. discharged with valacyclovir 1 g 3 times daily x10 days as well as bacitracin ointment twice daily and with close follow-up in ophthalmology office this Monday morning. 2. HIV on HAART, follows at [facility] 3. GERD stable on PPI    Consultants  . Discussed case with ophthalmology over the phone  Procedures  . None  Antibiotics   Anti-infectives (From admission, onward)   Start     Dose/Rate Route Frequency Ordered  Stop   09/16/19 1000  dapsone tablet 100 mg     100 mg Oral Daily 09/15/19 2208     09/16/19 0800  Darunavir-Cobicisctat-Emtricitabine-Tenofovir Alafenamide (SYMTUZA) 800-150-200-10 MG TABS 1 tablet     1 tablet Oral Daily with breakfast 09/15/19 2208     09/16/19 0000  valACYclovir (VALTREX) 1000 MG tablet     1,000 mg Oral 3 times daily 09/16/19 0958 09/26/19 2359   09/15/19 2200  acyclovir (ZOVIRAX) 700 mg in dextrose 5 % 100 mL IVPB     700 mg 114 mL/hr over 60 Minutes Intravenous Every 8 hours 09/15/19 2130          Subjective   Evaluated at bedside no acute distress resting comfortably.  States this Tuesday patient began having right-sided headache and started developing a vesicular rash.  Has not had any visual loss or significant pain to the eye but does report some soreness when she looks to the left, otherwise no pain with remaining EOM.  Denies fever, chills, night sweats.  Admits to distant history of shingles in other dermatomes.   Objective   Discharge Exam: Vitals:   09/16/19 0151 09/16/19 0523  BP: 116/79 100/63  Pulse: 77 77  Resp: 20 20  Temp: 98.1 F (36.7 C) 98.5 F (36.9 C)  SpO2: 99% 99%   Vitals:   09/16/19 0151 09/16/19 0206 09/16/19 0523 09/16/19 0523  BP: 116/79   100/63  Pulse: 77   77  Resp: 20   20  Temp: 98.1 F (36.7 C)   98.5 F (  36.9 C)  TempSrc: Oral   Oral  SpO2: 99%   99%  Weight:  70.8 kg 69.4 kg   Height:  5' 2.5" (1.588 m)      Physical Exam Vitals and nursing note reviewed.  Constitutional:      Appearance: Normal appearance.  HENT:     Nose: Nose normal.     Mouth/Throat:     Mouth: Mucous membranes are moist.  Eyes:     General: No scleral icterus.       Right eye: No discharge.        Left eye: No discharge.     Extraocular Movements: Extraocular movements intact.     Conjunctiva/sclera: Conjunctivae normal.     Pupils: Pupils are equal, round, and reactive to light.  Cardiovascular:     Rate and Rhythm: Normal  rate and regular rhythm.  Pulmonary:     Effort: Pulmonary effort is normal.     Breath sounds: Normal breath sounds.  Abdominal:     General: Abdomen is flat.     Palpations: Abdomen is soft.  Musculoskeletal:        General: No swelling.     Cervical back: Normal range of motion. No rigidity.  Skin:    Findings: Rash present.     Comments: Vesicular rash over right temple and hairline as well as right eyelid in V1 distribution    Neurological:     Mental Status: She is alert. Mental status is at baseline.  Psychiatric:        Mood and Affect: Mood normal.        Behavior: Behavior normal.       The results of significant diagnostics from this hospitalization (including imaging, microbiology, ancillary and laboratory) are listed below for reference.     Microbiology: Recent Results (from the past 240 hour(s))  SARS CORONAVIRUS 2 (TAT 6-24 HRS) Nasopharyngeal Nasopharyngeal Swab     Status: None   Collection Time: 09/15/19  9:19 PM   Specimen: Nasopharyngeal Swab  Result Value Ref Range Status   SARS Coronavirus 2 NEGATIVE NEGATIVE Final    Comment: (NOTE) SARS-CoV-2 target nucleic acids are NOT DETECTED. The SARS-CoV-2 RNA is generally detectable in upper and lower respiratory specimens during the acute phase of infection. Negative results do not preclude SARS-CoV-2 infection, do not rule out co-infections with other pathogens, and should not be used as the sole basis for treatment or other patient management decisions. Negative results must be combined with clinical observations, patient history, and epidemiological information. The expected result is Negative. Fact Sheet for Patients: SugarRoll.be Fact Sheet for Healthcare Providers: https://www.woods-mathews.com/ This test is not yet approved or cleared by the [place] FDA and  has been authorized for detection and/or diagnosis of SARS-CoV-2 by FDA under an Emergency  Use Authorization (EUA). This EUA will remain  in effect (meaning this test can be used) for the duration of the COVID-19 declaration under Section 56 4(b)(1) of the Act, 21 U.S.C. section 360bbb-3(b)(1), unless the authorization is terminated or revoked sooner. Performed at [facility], [place] [address], [place], [place] [address]      Labs: BNP (last 3 results) No results for input(s): BNP in the last 8760 hours. Basic Metabolic Panel: Recent Labs  Lab 09/15/19 2119 09/16/19 0225  NA 139 136  K 3.8 3.3*  CL 107 106  CO2 23 25  GLUCOSE 91 87  BUN 15 13  CREATININE 0.69 0.72  CALCIUM 9.1 8.9  Liver Function Tests: Recent Labs  Lab 09/16/19 0225  AST 15  ALT 13  ALKPHOS 51  BILITOT 0.9  PROT 8.5*  ALBUMIN 3.8   No results for input(s): LIPASE, AMYLASE in the last 168 hours. No results for input(s): AMMONIA in the last 168 hours. CBC: Recent Labs  Lab 09/15/19 2119 09/16/19 0225  WBC 4.9 4.2  NEUTROABS 2.7  --   HGB 11.7* 10.5*  HCT 35.9* 32.6*  MCV 89.8 90.1  PLT 245 210   Cardiac Enzymes: No results for input(s): CKTOTAL, CKMB, CKMBINDEX, TROPONINI in the last 168 hours. BNP: Invalid input(s): POCBNP CBG: No results for input(s): GLUCAP in the last 168 hours. D-Dimer No results for input(s): DDIMER in the last 72 hours. Hgb A1c No results for input(s): HGBA1C in the last 72 hours. Lipid Profile No results for input(s): CHOL, HDL, LDLCALC, TRIG, CHOLHDL, LDLDIRECT in the last 72 hours. Thyroid function studies No results for input(s): TSH, T4TOTAL, T3FREE, THYROIDAB in the last 72 hours.  Invalid input(s): FREET3 Anemia work up No results for input(s): VITAMINB12, FOLATE, FERRITIN, TIBC, IRON, RETICCTPCT in the last 72 hours. Urinalysis    Component Value Date/Time   COLORURINE YELLOW 11/15/2015 [place] 11/15/2015 1755   LABSPEC 1.008 11/15/2015 1755   PHURINE 7.0 11/15/2015 1755   GLUCOSEU NEGATIVE 11/15/2015 1755    HGBUR NEGATIVE 11/15/2015 [place] 11/15/2015 [place] 11/15/2015 1755   PROTEINUR NEGATIVE 11/15/2015 1755   UROBILINOGEN 0.2 08/08/2013 1327   NITRITE NEGATIVE 11/15/2015 1755   LEUKOCYTESUR NEGATIVE 11/15/2015 1755   Sepsis Labs Invalid input(s): PROCALCITONIN,  WBC,  LACTICIDVEN Microbiology Recent Results (from the past 240 hour(s))  SARS CORONAVIRUS 2 (TAT 6-24 HRS) Nasopharyngeal Nasopharyngeal Swab     Status: None   Collection Time: 09/15/19  9:19 PM   Specimen: Nasopharyngeal Swab  Result Value Ref Range Status   SARS Coronavirus 2 NEGATIVE NEGATIVE Final    Comment: (NOTE) SARS-CoV-2 target nucleic acids are NOT DETECTED. The SARS-CoV-2 RNA is generally detectable in upper and lower respiratory specimens during the acute phase of infection. Negative results do not preclude SARS-CoV-2 infection, do not rule out co-infections with other pathogens, and should not be used as the sole basis for treatment or other patient management decisions. Negative results must be combined with clinical observations, patient history, and epidemiological information. The expected result is Negative. Fact Sheet for Patients: SugarRoll.be Fact Sheet for Healthcare Providers: https://www.woods-mathews.com/ This test is not yet approved or cleared by the [place] FDA and  has been authorized for detection and/or diagnosis of SARS-CoV-2 by FDA under an Emergency Use Authorization (EUA). This EUA will remain  in effect (meaning this test can be used) for the duration of the COVID-19 declaration under Section 56 4(b)(1) of the Act, 21 U.S.C. section 360bbb-3(b)(1), unless the authorization is terminated or revoked sooner. Performed at [facility], [place] [address] [place], [place] [address]     Discharge Instructions     Discharge Instructions    Diet - low sodium heart healthy   Complete by: As  directed    Discharge instructions   Complete by: As directed    You were seen and examined in the hospital for shingles of the face and cared for by a hospitalist.   Upon Discharge:  - Take Valacyclovir 1000 mg three times daily for 10 days - use bacitracin ointment to your eye and skin lesions twice daily -  You have a walk in appointment on Monday, 2/8, at 8:30 am at [facility] [phone]) - If you begin to have worsening eye pain, redness, or decreased vision, then call the number above or return to the ED  Read the complete instructions along with all the possible side effects for all the medicines you take and that have been prescribed to you. Take any new medicines after you have completely understood and accept all the possible adverse reactions/side effects.   If you have any questions about your discharge medications or the care you received while you were in the hospital, you can call the unit and asked to speak with the hospitalist on call. Once you are discharged, your primary care physician will handle any further medical issues. Please note that NO REFILLS for any discharge medications will be authorized, as it is imperative that you return to your primary care physician (or establish a relationship with a primary care physician if you do not have one) for your aftercare needs so that they can reassess your need for medications and monitor your lab values.   Do not drive, operate heavy machinery, perform activities at heights, swimming or participation in water activities or provide baby sitting services if your were admitted for loss of consciousness/seizures or if you are on sedating medications including, but not limited to benzodiazepines, sleep medications, narcotic pain medications, etc., until you have been cleared to do so by a medical doctor.   Do not take more than prescribed medications.   Wear a seat belt while driving.  If you have smoked or  chewed Tobacco in the last 2 years please stop smoking; also stop any regular Alcohol and/or any Recreational drug use including marijuana.  If you experience worsening of your admission symptoms or develop shortness of breath, chest pain, suicidal or homicidal thoughts or experience a life threatening emergency, you must seek medical attention immediately by calling 911 or calling your PCP immediately.   Increase activity slowly   Complete by: As directed      Allergies as of 09/16/2019      Reactions   Penicillins Anaphylaxis, Shortness Of Breath, Swelling   Has patient had a PCN reaction causing immediate rash, facial/tongue/throat swelling, SOB or lightheadedness with hypotension: Yes Has patient had a PCN reaction causing severe rash involving mucus membranes or skin necrosis: No Has patient had a PCN reaction that required hospitalization Yes Has patient had a PCN reaction occurring within the last 10 years: No If all of the above answers are "NO", then may proceed with Cephalosporin use.   Sulfamethoxazole Anaphylaxis   Pt state turns feet blue    Flagyl [metronidazole] Swelling   Made feet blue and numb   Biktarvy [bictegravir-emtricitab-tenofov] Rash   Bruise/ rash on her left thigh      Medication List    STOP taking these medications   beclomethasone 80 MCG/ACT inhaler Commonly known as: QVAR   benzonatate 100 MG capsule Commonly known as: TESSALON   erythromycin ophthalmic ointment   ondansetron 4 MG disintegrating tablet Commonly known as: Zofran ODT   predniSONE 10 MG tablet Commonly known as: DELTASONE     TAKE these medications   acetaminophen 500 MG tablet Commonly known as: TYLENOL Take 1,000 mg by mouth every 6 (six) hours as needed for mild pain.   albuterol 108 (90 Base) MCG/ACT inhaler Commonly known as: VENTOLIN HFA Inhale 1-2 puffs into the lungs every 6 (six) hours as needed for wheezing or  shortness of breath. What changed: additional  instructions   bacitracin-polymyxin b ophthalmic ointment Commonly known as: POLYSPORIN Place 1 application into the right eye 2 (two) times daily. apply to eye every 12 hours while awake   dapsone 100 MG tablet Take 100 mg by mouth at bedtime.   Darunavir-Cobicisctat-Emtricitabine-Tenofovir Alafenamide 800-150-200-10 MG Tabs Commonly known as: SYMTUZA Take 1 tablet by mouth every evening.   ibuprofen 200 MG tablet Commonly known as: ADVIL Take 400 mg by mouth every 6 (six) hours as needed for headache or moderate pain.   polyvinyl alcohol 1.4 % ophthalmic solution Commonly known as: LIQUIFILM TEARS Place 1 drop into both eyes as needed for dry eyes (allergies).   valACYclovir 1000 MG tablet Commonly known as: Valtrex Take 1 tablet (1,000 mg total) by mouth 3 (three) times daily for 10 days.   vitamin E 200 UNIT capsule Generic drug: vitamin E Take 200 Units by mouth daily.      Follow-up Information    Katy Apo, MD. Go on 09/18/2019.   Specialty: Ophthalmology Why: 8:30 am for full eye exam Contact information: [address] [place] [address] [phone]          Allergies  Allergen Reactions  . Penicillins Anaphylaxis, Shortness Of Breath and Swelling    Has patient had a PCN reaction causing immediate rash, facial/tongue/throat swelling, SOB or lightheadedness with hypotension: Yes Has patient had a PCN reaction causing severe rash involving mucus membranes or skin necrosis: No Has patient had a PCN reaction that required hospitalization Yes Has patient had a PCN reaction occurring within the last 10 years: No If all of the above answers are "NO", then may proceed with Cephalosporin use.   . Sulfamethoxazole Anaphylaxis    Pt state turns feet blue   . Flagyl [Metronidazole] Swelling    Made feet blue and numb  . Biktarvy [Bictegravir-Emtricitab-Tenofov] Rash    Bruise/ rash on her left thigh    Time coordinating discharge: Over 30 minutes    SIGNED:   Harold Hedge, D.O. [facility] Pager: [phone]  09/16/2019, 2:05 PM

## 2019-09-16 NOTE — ED Notes (Signed)
ED TO INPATIENT HANDOFF REPORT  ED Nurse Name and Phone #: jon wled   S Name/Age/Gender Kristin Mason 49 y.o. female Room/Bed: WA11/WA11  Code Status   Code Status: Full Code  Home/SNF/Other Home Patient oriented to: self, place, time and situation Is this baseline? Yes   Triage Complete: Triage complete  Chief Complaint Shingles [B02.9]  Triage Note Patient complaining of rash over right eye. Patient states she had shingles before and wants to make sure that is not what it is.    Allergies Allergies  Allergen Reactions  . Penicillins Anaphylaxis, Shortness Of Breath and Swelling    Has patient had a PCN reaction causing immediate rash, facial/tongue/throat swelling, SOB or lightheadedness with hypotension: Yes Has patient had a PCN reaction causing severe rash involving mucus membranes or skin necrosis: No Has patient had a PCN reaction that required hospitalization Yes Has patient had a PCN reaction occurring within the last 10 years: No If all of the above answers are "NO", then may proceed with Cephalosporin use.   . Sulfamethoxazole Anaphylaxis    Pt state turns feet blue   . Flagyl [Metronidazole] Swelling    Made feet blue and numb  . Biktarvy [Bictegravir-Emtricitab-Tenofov] Rash    Bruise/ rash on her left thigh    Level of Care/Admitting Diagnosis ED Disposition    ED Disposition Condition Comment   Admit  Hospital Area: [facility] [[id number]]  Level of Care: Med-Surg [16]  Covid Evaluation: Asymptomatic Screening Protocol (No Symptoms)  Diagnosis: Shingles [id number]  Admitting Physician: Jani Gravel Wilkes  Attending Physician: Jani Gravel [[id number]]       B Medical/Surgery History Past Medical History:  Diagnosis Date  . Abnormal Pap smear 98   cryo therapy  . Asthma   . Eczema   . HIV (human immunodeficiency virus infection) ([place])   . STD (female)   . UTI (urinary tract infection)    Past Surgical History:   Procedure Laterality Date  . CESAREAN SECTION    . THERAPEUTIC ABORTION       A IV Location/Drains/Wounds Patient Lines/Drains/Airways Status   Active Line/Drains/Airways    Name:   Placement date:   Placement time:   Site:   Days:   Peripheral IV 09/15/19 Left Hand   09/15/19    2215    Hand   1          Intake/Output Last 24 hours No intake or output data in the 24 hours ending 09/16/19 0059  Labs/Imaging Results for orders placed or performed during the hospital encounter of 09/15/19 (from the past 48 hour(s))  CBC with Differential     Status: Abnormal   Collection Time: 09/15/19  9:19 PM  Result Value Ref Range   WBC 4.9 4.0 - 10.5 K/uL   RBC 4.00 3.87 - 5.11 MIL/uL   Hemoglobin 11.7 (L) 12.0 - 15.0 g/dL   HCT 35.9 (L) 36.0 - 46.0 %   MCV 89.8 80.0 - 100.0 fL   MCH 29.3 26.0 - 34.0 pg   MCHC 32.6 30.0 - 36.0 g/dL   RDW 14.1 11.5 - 15.5 %   Platelets 245 150 - 400 K/uL   nRBC 0.0 0.0 - 0.2 %   Neutrophils Relative % 55 %   Neutro Abs 2.7 1.7 - 7.7 K/uL   Lymphocytes Relative 34 %   Lymphs Abs 1.6 0.7 - 4.0 K/uL   Monocytes Relative 8 %   Monocytes Absolute 0.4 0.1 - 1.0 K/uL  Eosinophils Relative 3 %   Eosinophils Absolute 0.2 0.0 - 0.5 K/uL   Basophils Relative 0 %   Basophils Absolute 0.0 0.0 - 0.1 K/uL   Immature Granulocytes 0 %   Abs Immature Granulocytes 0.02 0.00 - 0.07 K/uL    Comment: Performed at [facility], [place] [address] [place], [place] [id number]  Basic metabolic panel     Status: None   Collection Time: 09/15/19  9:19 PM  Result Value Ref Range   Sodium 139 135 - 145 mmol/L   Potassium 3.8 3.5 - 5.1 mmol/L   Chloride 107 98 - 111 mmol/L   CO2 23 22 - 32 mmol/L   Glucose, Bld 91 70 - 99 mg/dL   BUN 15 6 - 20 mg/dL   Creatinine, Ser 0.69 0.44 - 1.00 mg/dL   Calcium 9.1 8.9 - 10.3 mg/dL   GFR calc non Af Amer >60 >60 mL/min   GFR calc Af Amer >60 >60 mL/min   Anion gap 9 5 - 15    Comment: Performed at [facility], [place] [address], [place], [place] [address]  hCG, quantitative, pregnancy     Status: None   Collection Time: 09/15/19  9:19 PM  Result Value Ref Range   hCG, Beta Chain, Quant, S <1 <5 mIU/mL    Comment:          GEST. AGE      CONC.  (mIU/mL)   <=1 WEEK        5 - 50     2 WEEKS       50 - 500     3 WEEKS       100 - 10,000     4 WEEKS     1,000 - 30,000     5 WEEKS     3,500 - 115,000   6-8 WEEKS     12,000 - 270,000    12 WEEKS     15,000 - 220,000        FEMALE AND NON-PREGNANT FEMALE:     LESS THAN 5 mIU/mL Performed at [facility], [place] [address], [place], [place] [address]    No results found.  Pending Labs Unresulted Labs (From admission, onward)    Start     Ordered   09/22/19 0500  Creatinine, serum  (enoxaparin (LOVENOX)    CrCl >/= 30 ml/min)  Weekly,   R    Comments: while on enoxaparin therapy    09/15/19 2113   09/16/19 0500  Comprehensive metabolic panel  Tomorrow morning,   R    Question:  Release to patient  Answer:  Immediate   09/15/19 2113   09/16/19 0500  CBC  Tomorrow morning,   R    Question:  Release to patient  Answer:  Immediate   09/15/19 2113   09/15/19 2047  SARS CORONAVIRUS 2 (TAT 6-24 HRS) Nasopharyngeal Nasopharyngeal Swab  (Tier 3 (TAT 6-24 hrs))  Once,   STAT    Question Answer Comment  Is this test for diagnosis or screening Screening   Symptomatic for COVID-19 as defined by CDC No   Hospitalized for COVID-19 No   Admitted to ICU for COVID-19 No   Previously tested for COVID-19 No   Resident in a congregate (group) care setting No   Employed in healthcare setting No   Pregnant Unknown      09/15/19 2046  Vitals/Pain Today's Vitals   09/15/19 1925 09/15/19 2032 09/15/19 2245 09/16/19 0024  BP:    110/72  Pulse:    81  Resp:    18  Temp:      TempSrc:      SpO2:    100%  Weight:      Height:      PainSc: 8  8  8       Isolation Precautions No active  isolations  Medications Medications  enoxaparin (LOVENOX) injection 40 mg (has no administration in time range)  acetaminophen (TYLENOL) tablet 650 mg (has no administration in time range)    Or  acetaminophen (TYLENOL) suppository 650 mg (has no administration in time range)  0.9 %  sodium chloride infusion ( Intravenous New Bag/Given 09/15/19 2217)  acyclovir (ZOVIRAX) 700 mg in dextrose 5 % 100 mL IVPB (700 mg Intravenous New Bag/Given 09/15/19 2238)  Darunavir-Cobicisctat-Emtricitabine-Tenofovir Alafenamide (SYMTUZA) 800-150-200-10 MG TABS 1 tablet (has no administration in time range)  dapsone tablet 100 mg (has no administration in time range)  HYDROcodone-acetaminophen (NORCO/VICODIN) 5-325 MG per tablet 2 tablet (2 tablets Oral Given 09/15/19 2032)    Mobility walks Low fall risk   Focused Assessments    R Recommendations: See Admitting Provider Note  Report given to:   Additional Notes:

## 2020-12-08 ENCOUNTER — Emergency Department (HOSPITAL_COMMUNITY)
Admission: EM | Admit: 2020-12-08 | Discharge: 2020-12-08 | Disposition: A | Discharge status: Home / Self Care | Payer: BC Managed Care – PPO | Attending: Emergency Medicine | Admitting: Emergency Medicine

## 2020-12-08 ENCOUNTER — Other Ambulatory Visit: Payer: Self-pay

## 2020-12-08 DIAGNOSIS — J45909 Unspecified asthma, uncomplicated: Secondary | ICD-10-CM | POA: Diagnosis not present

## 2020-12-08 DIAGNOSIS — R220 Localized swelling, mass and lump, head: Secondary | ICD-10-CM | POA: Diagnosis not present

## 2020-12-08 DIAGNOSIS — L723 Sebaceous cyst: Secondary | ICD-10-CM | POA: Diagnosis not present

## 2020-12-08 DIAGNOSIS — Z21 Asymptomatic human immunodeficiency virus [HIV] infection status: Secondary | ICD-10-CM | POA: Insufficient documentation

## 2020-12-08 DIAGNOSIS — Z23 Encounter for immunization: Secondary | ICD-10-CM | POA: Insufficient documentation

## 2020-12-08 DIAGNOSIS — L732 Hidradenitis suppurativa: Secondary | ICD-10-CM | POA: Insufficient documentation

## 2020-12-08 MED ORDER — TETANUS-DIPHTH-ACELL PERTUSSIS 5-2.5-18.5 LF-MCG/0.5 IM SUSY
0.5000 mL | PREFILLED_SYRINGE | Freq: Once | INTRAMUSCULAR | Status: AC
  Administered 2020-12-08: 0.5 mL via INTRAMUSCULAR
  Filled 2020-12-08: qty 0.5

## 2020-12-08 MED ORDER — LIDOCAINE-EPINEPHRINE-TETRACAINE (LET) TOPICAL GEL
3.0000 mL | Freq: Once | TOPICAL | Status: AC
  Administered 2020-12-08: 3 mL via TOPICAL
  Filled 2020-12-08: qty 3

## 2020-12-08 MED ORDER — LIDOCAINE HCL (PF) 1 % IJ SOLN
5.0000 mL | Freq: Once | INTRAMUSCULAR | Status: DC
  Filled 2020-12-08: qty 30

## 2020-12-08 MED ORDER — DOXYCYCLINE HYCLATE 100 MG PO CAPS: 100.0000 mg | ORAL_CAPSULE | Freq: Two times a day (BID) | ORAL | 0 refills | Status: AC

## 2020-12-08 NOTE — Discharge Instructions (Signed)
  Wound Care - After I&D of Abscess  You may remove the bandage after 24 hours.  The only reason to replace the bandage is to protect clothing from drainage. Bandages, if used, should be replaced daily or whenever soiled. The wound may continue to drain for the next 2-3 days.   Cleaning: Clean the wound and surrounding area gently with tap water and mild soap. Rinse well and blot dry. You may shower normally. Soaking the wound in Epsom salt baths for no more than 15 minutes once a day may help rinse out any remaining pus and help with wound healing.  Clean the wound daily to prevent further infection. Do not use cleaners such as hydrogen peroxide or alcohol.   Scar reduction: Application of a topical antibiotic ointment, such as Neosporin, after the wound has begun to close and heal well can decrease scab formation and reduce scarring. After the wound has healed, application of ointments such as Aquaphor can also reduce scar formation.  The key to scar reduction is keeping the skin well hydrated and supple. Drinking plenty of water throughout the day (At least eight 8oz glasses of water a day) is essential to staying well hydrated.  Sun exposure: Keep the wound out of the sun. After the wound has healed, continue to protect it from the sun by wearing protective clothing or applying sunscreen.  Pain: You may use Tylenol, naproxen, or ibuprofen for pain.  Prevention: There are some people who have a predisposition to abscess formation, however, there are some things that can be done to prevent abscesses in many people.  Most abscesses form because bacteria that naturally lives on the skin gets trapped underneath the skin.  This can occur through openings too small to see. Before and after any area of skin is shaved, wax, or abraded in any manner, the area should be washed with soap and water and rinsed well.   If you are having trouble with recurrent abscesses, it may be wise to perform a chlorhexidine  wash regimen.  For 1 week, wash all of your body with chlorhexidine (available over-the-counter at most pharmacies). You may also need to reevaluate your use of daily soap as soaps with perfumes or dyes can increase the chances of infection in some people.  Follow up: Please return to the ED or go to your primary care provider in 2-3 days for a wound check to assure proper healing.  Return: Return to the ED sooner should signs of worsening infection arise, such as spreading redness, worsening puffiness/swelling, severe increase in pain, fever over 100.24F, or any other major issues.  For prescription assistance, may try using prescription discount sites or apps, such as goodrx.com

## 2020-12-08 NOTE — ED Triage Notes (Signed)
C/o having "boil" front of her hairline. Had another on the right side that resolved after removing braids from her hair. Facial swelling began yesterday post nap and again this morning upon waking. Denies SOB.

## 2020-12-08 NOTE — ED Provider Notes (Signed)
[facility] DEPT Provider Note   CSN: [id number] Arrival date & time: 12/08/20  0937     History Chief Complaint  Patient presents with  . Abscess    Page Kristin Mason is a 50 y.o. female.  HPI     Kristin Mason is a 50 y.o. female, with a history of HIV, asthma, presenting to the ED with area of painful swelling to the scalp noted Tuesday, April 26. Just prior to this, she states she had woven braids removed from her hair. She has noted some bloody and purulent drainage over the last couple days. She also noted some surrounding edema over the last couple days causing some edema to the face.  Denies fever/chills, facial pain, other scalp pain/swelling, sinus pain, ear pain, headaches, vomiting, or any other complaints.  HIV quant was undetectable and CD4 count was 100 when last checked April 19, 2020.  Past Medical History:  Diagnosis Date  . Abnormal Pap smear 98   cryo therapy  . Asthma   . Eczema   . HIV (human immunodeficiency virus infection) ([place])   . STD (female)   . UTI (urinary tract infection)     Patient Active Problem List   Diagnosis Date Noted  . Shingles 09/15/2019  . HIV (human immunodeficiency virus infection) ([place]) 09/15/2019  . Unspecified asthma(493.90) 05/01/2013  . Rash and nonspecific skin eruption 05/01/2013  . Allergic rhinitis 05/01/2013  . Hypokalemia 04/03/2013  . Acute respiratory failure with hypoxia ([place]) 04/03/2013    Past Surgical History:  Procedure Laterality Date  . CESAREAN SECTION    . THERAPEUTIC ABORTION       OB History    Gravida  4   Para  3   Term  3   Preterm  0   AB  1   Living  3     SAB  0   IAB  1   Ectopic  0   Multiple  0   Live Births              Family History  Problem Relation Age of Onset  . Heart disease Maternal Grandmother   . Hypertension Maternal Grandmother   . Hypertension Mother   . Diabetes Mother   .  Hyperlipidemia Mother   . Heart disease Mother   . Stroke Mother   . Diabetes Paternal Grandmother     Social History   Tobacco Use  . Smoking status: Never Smoker  . Smokeless tobacco: Never Used  Substance Use Topics  . Alcohol use: Yes    Comment: socially  . Drug use: No    Home Medications Prior to Admission medications   Medication Sig Start Date End Date Taking? Authorizing Provider  doxycycline (VIBRAMYCIN) 100 MG capsule Take 1 capsule (100 mg total) by mouth 2 (two) times daily for 5 days. 12/08/20 12/13/20 Yes Ellenore Roscoe C, PA-C  acetaminophen (TYLENOL) 500 MG tablet Take 1,000 mg by mouth every 6 (six) hours as needed for mild pain.    [provider]  albuterol (PROVENTIL HFA;VENTOLIN HFA) 108 (90 Base) MCG/ACT inhaler Inhale 1-2 puffs into the lungs every 6 (six) hours as needed for wheezing or shortness of breath. Patient taking differently: Inhale 1-2 puffs into the lungs every 6 (six) hours as needed for wheezing or shortness of breath. Wheezing 08/26/17   Fransico Meadow, PA-C  bacitracin-polymyxin b (POLYSPORIN) ophthalmic ointment Place 1 application into the right eye 2 (two) times daily. apply  to eye every 12 hours while awake 09/16/19   Harold Hedge, MD  dapsone 100 MG tablet Take 100 mg by mouth at bedtime.  05/05/19   [provider]  Darunavir-Cobicisctat-Emtricitabine-Tenofovir Alafenamide (SYMTUZA) 800-150-200-10 MG TABS Take 1 tablet by mouth every evening.  01/09/19   [provider]  ibuprofen (ADVIL,MOTRIN) 200 MG tablet Take 400 mg by mouth every 6 (six) hours as needed for headache or moderate pain.    [provider]  polyvinyl alcohol (LIQUIFILM TEARS) 1.4 % ophthalmic solution Place 1 drop into both eyes as needed for dry eyes (allergies).    [provider]  vitamin E (VITAMIN E) 200 UNIT capsule Take 200 Units by mouth daily.    [provider]    Allergies    Penicillins, Sulfamethoxazole, Flagyl  [metronidazole], and Biktarvy [bictegravir-emtricitab-tenofov]  Review of Systems   Review of Systems  Constitutional: Negative for chills, diaphoresis and fever.  HENT: Positive for facial swelling. Negative for dental problem, sinus pressure, sinus pain, sore throat and trouble swallowing.   Gastrointestinal: Negative for nausea and vomiting.  Musculoskeletal: Negative for neck pain and neck stiffness.  Skin: Positive for rash and wound.       Scalp abscess    Physical Exam Updated Vital Signs BP 111/77   Pulse 80   Temp 98.7 F (37.1 C) (Oral)   Resp 16   SpO2 97%   Physical Exam Vitals and nursing note reviewed.  Constitutional:      General: She is not in acute distress.    Appearance: She is well-developed. She is not diaphoretic.  HENT:     Head: Normocephalic and atraumatic.     Comments: Tender, fluctuant mass just behind the hairline of the frontal/right parietal scalp, just right of the midline.  Mass is approximately 1.5 to 2 cm in diameter.  There is some erythema to the mass and approximately 0.5 cm around the mass. I am able to express some bloody purulent discharge from the center of the mass.  Some edema noted to the patient's face, especially in the periorbital region and brow bilaterally.  There is some pitting to this edema.  It is nontender, nonerythematous, no increased warmth. Eyes:     Conjunctiva/sclera: Conjunctivae normal.  Cardiovascular:     Rate and Rhythm: Normal rate and regular rhythm.  Pulmonary:     Effort: Pulmonary effort is normal.  Musculoskeletal:     Cervical back: Normal range of motion and neck supple. No tenderness.  Lymphadenopathy:     Cervical: No cervical adenopathy.  Skin:    General: Skin is warm and dry.     Coloration: Skin is not pale.  Neurological:     Mental Status: She is alert and oriented to person, place, and time.     Comments: No noted acute cognitive deficit. Sensation grossly intact to light touch in the  extremities.   Grip strengths equal bilaterally.   Strength 5/5 in all extremities.  No gait disturbance.  Coordination intact.  Cranial nerves III-XII grossly intact.  Handles oral secretions without noted difficulty.  No noted phonation or speech deficit. No facial droop.   Psychiatric:        Behavior: Behavior normal.     ED Results / Procedures / Treatments   Labs (all labs ordered are listed, but only abnormal results are displayed) Labs Reviewed - No data to display  EKG None  Radiology No results found.  Procedures .Marland KitchenIncision and Drainage  Date/Time:  12/08/2020 11:30 AM Performed by: Lorayne Bender, PA-C Authorized by: Lorayne Bender, PA-C   Consent:    Consent obtained:  Verbal   Consent given by:  Patient   Risks, benefits, and alternatives were discussed: yes     Risks discussed:  Bleeding, incomplete drainage, pain and infection Universal protocol:    Procedure explained and questions answered to patient or proxy's satisfaction: yes     Patient identity confirmed:  Verbally with patient and provided demographic data Location:    Type:  Abscess   Size:  2cm   Location:  Head   Head location:  Scalp Pre-procedure details:    Skin preparation:  Povidone-iodine Sedation:    Sedation type:  None Anesthesia:    Anesthesia method:  Topical application   Topical anesthetic:  LET Procedure type:    Complexity:  Complex Procedure details:    Incision types:  Stab incision   Incision depth:  Subcutaneous   Wound management:  Irrigated with saline and extensive cleaning   Drainage:  Purulent (Also likely sebaceous material)   Drainage amount:  Moderate   Wound treatment:  Wound left open   Packing materials:  None Post-procedure details:    Procedure completion:  Tolerated well, no immediate complications     Medications Ordered in ED Medications  lidocaine (PF) (XYLOCAINE) 1 % injection 5 mL (has no administration in time range)  Tdap (BOOSTRIX)  injection 0.5 mL (0.5 mLs Intramuscular Given 12/08/20 1043)  lidocaine-EPINEPHrine-tetracaine (LET) topical gel (3 mLs Topical Given 12/08/20 1049)    ED Course  I have reviewed the triage vital signs and the nursing notes.  Pertinent labs & imaging results that were available during my care of the patient were reviewed by me and considered in my medical decision making (see chart for details).    MDM Rules/Calculators/A&P                          Patient presents with fluctuant mass on the scalp. Suspect possibly inflamed sebaceous cyst. Shared decision-making regarding topical versus injected analgesia.  Patient opted for topical. The patient was given instructions for home care as well as return precautions. Patient voices understanding of these instructions, accepts the plan, and is comfortable with discharge.     Final Clinical Impression(s) / ED Diagnoses Final diagnoses:  Inflamed sebaceous cyst    Rx / DC Orders ED Discharge Orders         Ordered    doxycycline (VIBRAMYCIN) 100 MG capsule  2 times daily        12/08/20 Hanna, Shellye Zandi C, PA-C 12/08/20 1542    Lacretia Leigh, MD 12/10/20 1012

## 2021-05-07 DIAGNOSIS — Z716 Tobacco abuse counseling: Secondary | ICD-10-CM | POA: Diagnosis not present

## 2021-05-07 DIAGNOSIS — Z01411 Encounter for gynecological examination (general) (routine) with abnormal findings: Secondary | ICD-10-CM | POA: Diagnosis not present

## 2021-05-07 DIAGNOSIS — R87612 Low grade squamous intraepithelial lesion on cytologic smear of cervix (LGSIL): Secondary | ICD-10-CM | POA: Diagnosis not present

## 2021-05-07 DIAGNOSIS — N941 Unspecified dyspareunia: Secondary | ICD-10-CM | POA: Diagnosis not present

## 2021-05-07 DIAGNOSIS — B373 Candidiasis of vulva and vagina: Secondary | ICD-10-CM | POA: Diagnosis not present

## 2021-05-07 DIAGNOSIS — Z1151 Encounter for screening for human papillomavirus (HPV): Secondary | ICD-10-CM | POA: Diagnosis not present

## 2021-05-07 DIAGNOSIS — Z8619 Personal history of other infectious and parasitic diseases: Secondary | ICD-10-CM | POA: Diagnosis not present

## 2021-05-07 DIAGNOSIS — R875 Abnormal microbiological findings in specimens from female genital organs: Secondary | ICD-10-CM | POA: Diagnosis not present

## 2021-05-07 DIAGNOSIS — Z872 Personal history of diseases of the skin and subcutaneous tissue: Secondary | ICD-10-CM | POA: Diagnosis not present

## 2021-05-07 DIAGNOSIS — B20 Human immunodeficiency virus [HIV] disease: Secondary | ICD-10-CM | POA: Diagnosis not present

## 2021-05-07 DIAGNOSIS — Z0000 Encounter for general adult medical examination without abnormal findings: Secondary | ICD-10-CM | POA: Insufficient documentation

## 2021-05-30 DIAGNOSIS — B20 Human immunodeficiency virus [HIV] disease: Secondary | ICD-10-CM | POA: Diagnosis not present

## 2021-05-30 DIAGNOSIS — N760 Acute vaginitis: Secondary | ICD-10-CM | POA: Diagnosis not present

## 2021-05-30 DIAGNOSIS — B9689 Other specified bacterial agents as the cause of diseases classified elsewhere: Secondary | ICD-10-CM | POA: Diagnosis not present

## 2021-06-10 DIAGNOSIS — L0293 Carbuncle, unspecified: Secondary | ICD-10-CM | POA: Diagnosis not present

## 2021-06-17 DIAGNOSIS — L732 Hidradenitis suppurativa: Secondary | ICD-10-CM | POA: Diagnosis not present

## 2021-07-16 DIAGNOSIS — D251 Intramural leiomyoma of uterus: Secondary | ICD-10-CM | POA: Insufficient documentation

## 2021-07-16 DIAGNOSIS — R87618 Other abnormal cytological findings on specimens from cervix uteri: Secondary | ICD-10-CM | POA: Insufficient documentation

## 2021-07-16 DIAGNOSIS — N852 Hypertrophy of uterus: Secondary | ICD-10-CM | POA: Insufficient documentation

## 2021-09-04 DIAGNOSIS — N760 Acute vaginitis: Secondary | ICD-10-CM | POA: Insufficient documentation

## 2022-03-31 ENCOUNTER — Other Ambulatory Visit: Payer: Self-pay

## 2022-03-31 ENCOUNTER — Ambulatory Visit
Admission: EM | Admit: 2022-03-31 | Discharge: 2022-03-31 | Disposition: A | Discharge status: Home / Self Care | Payer: BC Managed Care – PPO | Attending: Internal Medicine | Admitting: Internal Medicine

## 2022-03-31 ENCOUNTER — Encounter: Payer: Self-pay | Admitting: Emergency Medicine

## 2022-03-31 DIAGNOSIS — R350 Frequency of micturition: Secondary | ICD-10-CM

## 2022-03-31 DIAGNOSIS — R300 Dysuria: Secondary | ICD-10-CM

## 2022-03-31 DIAGNOSIS — N3000 Acute cystitis without hematuria: Secondary | ICD-10-CM

## 2022-03-31 DIAGNOSIS — H65193 Other acute nonsuppurative otitis media, bilateral: Secondary | ICD-10-CM

## 2022-03-31 DIAGNOSIS — J020 Streptococcal pharyngitis: Secondary | ICD-10-CM

## 2022-03-31 LAB — POCT URINALYSIS DIP (MANUAL ENTRY)
Blood, UA: NEGATIVE
Glucose, UA: 100 mg/dL — AB
Nitrite, UA: POSITIVE — AB
Protein Ur, POC: 100 mg/dL — AB
Spec Grav, UA: 1.025 (ref 1.010–1.025)
Urobilinogen, UA: 1 E.U./dL
pH, UA: 6 (ref 5.0–8.0)

## 2022-03-31 LAB — POCT RAPID STREP A (OFFICE): Rapid Strep A Screen: POSITIVE — AB

## 2022-03-31 MED ORDER — NITROFURANTOIN MONOHYD MACRO 100 MG PO CAPS: 100.0000 mg | ORAL_CAPSULE | Freq: Two times a day (BID) | ORAL | 0 refills | Status: DC

## 2022-03-31 MED ORDER — FLUCONAZOLE 150 MG PO TABS: 150.0000 mg | ORAL_TABLET | Freq: Every day | ORAL | 0 refills | Status: DC

## 2022-03-31 MED ORDER — AZITHROMYCIN 250 MG PO TABS: ORAL_TABLET | ORAL | 0 refills | Status: AC

## 2022-03-31 NOTE — Discharge Instructions (Signed)
You have tested positive for strep throat which is being treated with azithromycin antibiotic.  You also have a urinary tract infection which is being treated Macrobid antibiotic.  Urine culture is pending.  We will call if it is abnormal.  Please follow-up if symptoms persist or worsen.

## 2022-03-31 NOTE — ED Triage Notes (Signed)
Pt sts dysuria x 4 days; pt sts some ear itching and burning in throat

## 2022-03-31 NOTE — ED Provider Notes (Addendum)
[facility]    CSN: [id number] Arrival date & time: 03/31/22  1848      History   Chief Complaint Chief Complaint  Patient presents with   Dysuria    HPI Kristin Mason is a 51 y.o. female.   Patient presents with several different chief complaints today.  Patient reports urinary burning, urinary frequency, mild lower abdominal cramping that started about 4 days ago.  Denies vaginal discharge, hematuria, back pain, fever, abnormal vaginal bleeding.  Patient reports that she is about to start her menstrual cycle as well.  Patient also reports sore throat and right ear itching.  Also reports some mild nonproductive cough.  Denies nasal congestion, runny nose, fever.  Patient reports that her grandchild recently had strep throat.  Patient has not taken any medications to alleviate symptoms.  Denies chest pain, shortness of breath, nausea, vomiting, diarrhea.   Dysuria   Past Medical History:  Diagnosis Date   Abnormal Pap smear 98   cryo therapy   Asthma    Eczema    HIV (human immunodeficiency virus infection) ([place])    STD (female)    UTI (urinary tract infection)     Patient Active Problem List   Diagnosis Date Noted   Shingles 09/15/2019   HIV (human immunodeficiency virus infection) ([place]) 09/15/2019   Unspecified asthma(493.90) 05/01/2013   Rash and nonspecific skin eruption 05/01/2013   Allergic rhinitis 05/01/2013   Hypokalemia 04/03/2013   Acute respiratory failure with hypoxia ([place]) 04/03/2013    Past Surgical History:  Procedure Laterality Date   CESAREAN SECTION     THERAPEUTIC ABORTION      OB History     Gravida  4   Para  3   Term  3   Preterm  0   AB  1   Living  3      SAB  0   IAB  1   Ectopic  0   Multiple  0   Live Births               Home Medications    Prior to Admission medications   Medication Sig Start Date End Date Taking? Authorizing Provider  azithromycin (ZITHROMAX Z-PAK) 250 MG  tablet Take 2 tablets (500 mg total) by mouth daily for 1 day, THEN 1 tablet (250 mg total) daily for 4 days. 03/31/22 04/05/22 Yes Hilberto Burzynski, Michele Rockers, FNP  fluconazole (DIFLUCAN) 150 MG tablet Take 1 tablet (150 mg total) by mouth daily. Take at first sign of vaginal yeast 03/31/22  Yes Fidel Caggiano, Lenwood E, FNP  nitrofurantoin, macrocrystal-monohydrate, (MACROBID) 100 MG capsule Take 1 capsule (100 mg total) by mouth 2 (two) times daily. 03/31/22  Yes Wrenna Saks, Hildred Alamin E, FNP  acetaminophen (TYLENOL) 500 MG tablet Take 1,000 mg by mouth every 6 (six) hours as needed for mild pain.    [provider]  albuterol (PROVENTIL HFA;VENTOLIN HFA) 108 (90 Base) MCG/ACT inhaler Inhale 1-2 puffs into the lungs every 6 (six) hours as needed for wheezing or shortness of breath. Patient taking differently: Inhale 1-2 puffs into the lungs every 6 (six) hours as needed for wheezing or shortness of breath. Wheezing 08/26/17   Fransico Meadow, PA-C  bacitracin-polymyxin b (POLYSPORIN) ophthalmic ointment Place 1 application into the right eye 2 (two) times daily. apply to eye every 12 hours while awake 09/16/19   Harold Hedge, MD  dapsone 100 MG tablet Take 100 mg by mouth at bedtime.  05/05/19  [provider]  Darunavir-Cobicisctat-Emtricitabine-Tenofovir Alafenamide (SYMTUZA) 800-150-200-10 MG TABS Take 1 tablet by mouth every evening.  01/09/19   [provider]  ibuprofen (ADVIL,MOTRIN) 200 MG tablet Take 400 mg by mouth every 6 (six) hours as needed for headache or moderate pain.    [provider]  polyvinyl alcohol (LIQUIFILM TEARS) 1.4 % ophthalmic solution Place 1 drop into both eyes as needed for dry eyes (allergies).    [provider]  vitamin E (VITAMIN E) 200 UNIT capsule Take 200 Units by mouth daily.    [provider]    Family History Family History  Problem Relation Age of Onset   Heart disease Maternal Grandmother    Hypertension Maternal Grandmother     Hypertension Mother    Diabetes Mother    Hyperlipidemia Mother    Heart disease Mother    Stroke Mother    Diabetes Paternal Grandmother     Social History Social History   Tobacco Use   Smoking status: Never   Smokeless tobacco: Never  Substance Use Topics   Alcohol use: Yes    Comment: socially   Drug use: No     Allergies   Penicillins, Sulfamethoxazole, Flagyl [metronidazole], and Biktarvy [bictegravir-emtricitab-tenofov]   Review of Systems Review of Systems Per HPI  Physical Exam Triage Vital Signs ED Triage Vitals  Enc Vitals Group     BP 03/31/22 1913 119/82     Pulse Rate 03/31/22 1913 87     Resp 03/31/22 1913 18     Temp 03/31/22 1913 98.2 F (36.8 C)     Temp Source 03/31/22 1913 Oral     SpO2 03/31/22 1913 96 %     Weight --      Height --      Head Circumference --      Peak Flow --      Pain Score 03/31/22 1914 5     Pain Loc --      Pain Edu? --      Excl. in [place]? --    No data found.  Updated Vital Signs BP 119/82 (BP Location: Right Arm)   Pulse 87   Temp 98.2 F (36.8 C) (Oral)   Resp 18   SpO2 96%   Visual Acuity Right Eye Distance:   Left Eye Distance:   Bilateral Distance:    Right Eye Near:   Left Eye Near:    Bilateral Near:     Physical Exam Constitutional:      General: She is not in acute distress.    Appearance: Normal appearance. She is not toxic-appearing or diaphoretic.  HENT:     Head: Normocephalic and atraumatic.     Right Ear: Ear canal normal. A middle ear effusion is present. Tympanic membrane is bulging. Tympanic membrane is not erythematous or retracted.     Left Ear: Ear canal normal. A middle ear effusion is present. Tympanic membrane is bulging. Tympanic membrane is not erythematous or retracted.     Nose: Congestion present.     Mouth/Throat:     Mouth: Mucous membranes are moist.     Pharynx: Posterior oropharyngeal erythema present. No pharyngeal swelling, oropharyngeal exudate or uvula  swelling.     Tonsils: No tonsillar exudate or tonsillar abscesses.  Eyes:     Extraocular Movements: Extraocular movements intact.     Conjunctiva/sclera: Conjunctivae normal.     Pupils: Pupils are equal, round, and reactive to light.  Cardiovascular:  Rate and Rhythm: Normal rate and regular rhythm.     Pulses: Normal pulses.     Heart sounds: Normal heart sounds.  Pulmonary:     Effort: Pulmonary effort is normal. No respiratory distress.     Breath sounds: Normal breath sounds. No stridor. No wheezing, rhonchi or rales.  Abdominal:     General: Abdomen is flat. Bowel sounds are normal. There is no distension.     Palpations: Abdomen is soft.     Tenderness: There is no abdominal tenderness.  Musculoskeletal:        General: Normal range of motion.     Cervical back: Normal range of motion.  Skin:    General: Skin is warm and dry.  Neurological:     General: No focal deficit present.     Mental Status: She is alert and oriented to person, place, and time. Mental status is at baseline.  Psychiatric:        Mood and Affect: Mood normal.        Behavior: Behavior normal.   Tested positive   UC Treatments / Results  Labs (all labs ordered are listed, but only abnormal results are displayed) Labs Reviewed  POCT URINALYSIS DIP (MANUAL ENTRY) - Abnormal; Notable for the following components:      Result Value   Color, UA orange (*)    Glucose, UA =100 (*)    Bilirubin, UA small (*)    Ketones, POC UA trace (5) (*)    Protein Ur, POC =100 (*)    Nitrite, UA Positive (*)    Leukocytes, UA Trace (*)    All other components within normal limits  POCT RAPID STREP A (OFFICE) - Abnormal; Notable for the following components:   Rapid Strep A Screen Positive (*)    All other components within normal limits  URINE CULTURE    EKG   Radiology No results found.  Procedures Procedures (including critical care time)  Medications Ordered in UC Medications - No data to  display  Initial Impression / Assessment and Plan / UC Course  I have reviewed the triage vital signs and the nursing notes.  Pertinent labs & imaging results that were available during my care of the patient were reviewed by me and considered in my medical decision making (see chart for details).     Rapid strep was positive.  Will treat with azithromycin given patient's penicillin allergy.  No signs of peritonsillar abscess on exam.  Discussed supportive care related to this.  Antibiotic should also cover for ear discomfort.  Patient also has urinary tract infection.  Has penicillin allergy and it does not appear that she has ever taken cephalosporins so will add a second antibiotic named Macrobid to ensure adequate coverage.  Patient to take medications with food.  Urine culture is pending.  Patient requesting Diflucan given antibiotics typically cause yeast infections.  Will send 1 pill for patient to take at first sign of vaginal yeast infection.  patient advised to follow-up if any symptoms persist or worsen.  Patient verbalized understanding and was agreeable with plan. Final Clinical Impressions(s) / UC Diagnoses   Final diagnoses:  Acute cystitis without hematuria  Acute MEE (middle ear effusion), bilateral  Dysuria  Urinary frequency  Strep pharyngitis     Discharge Instructions      You have tested positive for strep throat which is being treated with azithromycin antibiotic.  You also have a urinary tract infection which is being treated Macrobid  antibiotic.  Urine culture is pending.  We will call if it is abnormal.  Please follow-up if symptoms persist or worsen.     ED Prescriptions     Medication Sig Dispense Auth. Provider   nitrofurantoin, macrocrystal-monohydrate, (MACROBID) 100 MG capsule Take 1 capsule (100 mg total) by mouth 2 (two) times daily. 10 capsule [place], Hildred Alamin E, [place]   azithromycin (ZITHROMAX Z-PAK) 250 MG tablet Take 2 tablets (500 mg total) by mouth  daily for 1 day, THEN 1 tablet (250 mg total) daily for 4 days. 6 tablet [place], Ravena E, [place]   fluconazole (DIFLUCAN) 150 MG tablet Take 1 tablet (150 mg total) by mouth daily. Take at first sign of vaginal yeast 1 tablet [place], Michele Rockers, [place]      PDMP not reviewed this encounter.   Teodora Medici, [place] 03/31/22 [place], [place], [place] 03/31/22 [phone]

## 2022-04-02 LAB — URINE CULTURE: Culture: <10000 — AB

## 2022-04-28 ENCOUNTER — Telehealth: Payer: Self-pay

## 2022-04-28 ENCOUNTER — Other Ambulatory Visit (HOSPITAL_COMMUNITY): Payer: Self-pay

## 2022-04-28 NOTE — Telephone Encounter (Signed)
[facility] Patient Teacher, English as a foreign language completed.    The patient is insured through [facility] and has a $485.00 copay.  Patient will need a Copay Card if she do not have one already.   We will continue to follow to see if copay assistance is needed.  Ileene Patrick, [facility] Phone: [phone] Fax:  [phone]

## 2022-04-29 ENCOUNTER — Other Ambulatory Visit: Payer: Self-pay

## 2022-04-29 ENCOUNTER — Ambulatory Visit: Payer: BC Managed Care – PPO | Admitting: Pharmacist

## 2022-04-29 ENCOUNTER — Ambulatory Visit (INDEPENDENT_AMBULATORY_CARE_PROVIDER_SITE_OTHER): Payer: BC Managed Care – PPO | Admitting: Infectious Diseases

## 2022-04-29 ENCOUNTER — Encounter: Payer: Self-pay | Admitting: Infectious Diseases

## 2022-04-29 VITALS — BP 113/78 | HR 91 | Temp 98.0°F | Wt 162.0 lb

## 2022-04-29 DIAGNOSIS — B20 Human immunodeficiency virus [HIV] disease: Secondary | ICD-10-CM

## 2022-04-29 DIAGNOSIS — D251 Intramural leiomyoma of uterus: Secondary | ICD-10-CM

## 2022-04-29 DIAGNOSIS — Z0000 Encounter for general adult medical examination without abnormal findings: Secondary | ICD-10-CM

## 2022-04-29 NOTE — Patient Instructions (Addendum)
Try to take the Symtuza every evening with a snack - it absorbs best with food.  Restart the dapsone as well with your symtuza   I would like to see you back in 4 months to follow up on how you are doing. All lab tests I will put notes in for your mychart too.   Please call Joycelyn Schmid @ [phone] to schedule a dental appointment.

## 2022-04-29 NOTE — Progress Notes (Unsigned)
Name: Kristin Mason  DOB: 1971/07/06 MRN: [id number] PCP: Pcp, No    Patient Active Problem List   Diagnosis Date Noted   Shingles 09/15/2019   HIV (human immunodeficiency virus infection) ([place]) 09/15/2019   Unspecified asthma(493.90) 05/01/2013   Rash and nonspecific skin eruption 05/01/2013   Allergic rhinitis 05/01/2013   Hypokalemia 04/03/2013   Acute respiratory failure with hypoxia ([place]) 04/03/2013     Brief Narrative:  Special Ranes is a 51 y.o. female transferring care for HIV Sept 2023 from [place] in [place] with Eliseo Gum, FNP. CD4 nadir *** VL *** HIV Risk: *** History of OIs: *** Intake Labs ***: Hep B sAg (***), sAb (***), cAb (***); Hep A (***), Hep C (***) Quantiferon (***) HLA B*5701 (***) G6PD: (***)   Previous Regimens: Biktarvy - rash  Dapsone Symtuza   Genotypes: ***  Subjective:  No chief complaint on file.    HPI: Kristin Mason is a 51 y.o. female transferring HIV care transferring care for HIV Sept 2023 from [place] in [place] with Eliseo Gum, [place] in Nov 2022 - takin gSymtuza once daily at that time. Insured through [employer].   She was initially diagnosed in 1999 and did not receive medications until 2019 or so as she was previously considered a long term non-progresser / elite controller at the time.   H/O HSV - on daily suppressive valtrex 1g, but does not take this as frequently.   H/O Hiadrenitis Suppuritiva with I&D in Fall 2022. No symptoms of this at the moment.   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 Had colposcopy in April 2023 with Dr. Joellyn Rued. History of previous cryo treatments years past. Path CIN1. She has not received HPV vaccines and aged out.       No data to display            ROS  Past Medical History:  Diagnosis Date   Abnormal Pap smear 98   cryo therapy   Asthma    Eczema    HIV (human  immunodeficiency virus infection) ([place])    STD (female)    UTI (urinary tract infection)     Outpatient Medications Prior to Visit  Medication Sig Dispense Refill   acetaminophen (TYLENOL) 500 MG tablet Take 1,000 mg by mouth every 6 (six) hours as needed for mild pain.     albuterol (PROVENTIL HFA;VENTOLIN HFA) 108 (90 Base) MCG/ACT inhaler Inhale 1-2 puffs into the lungs every 6 (six) hours as needed for wheezing or shortness of breath. (Patient taking differently: Inhale 1-2 puffs into the lungs every 6 (six) hours as needed for wheezing or shortness of breath. Wheezing) 1 Inhaler 0   bacitracin-polymyxin b (POLYSPORIN) ophthalmic ointment Place 1 application into the right eye 2 (two) times daily. apply to eye every 12 hours while awake 3.5 g 0   dapsone 100 MG tablet Take 100 mg by mouth at bedtime.      Darunavir-Cobicisctat-Emtricitabine-Tenofovir Alafenamide (SYMTUZA) 800-150-200-10 MG TABS Take 1 tablet by mouth every evening.      fluconazole (DIFLUCAN) 150 MG tablet Take 1 tablet (150 mg total) by mouth daily. Take at first sign of vaginal yeast 1 tablet 0   ibuprofen (ADVIL,MOTRIN) 200 MG tablet Take 400 mg by mouth every 6 (six) hours as needed for headache or moderate pain.     nitrofurantoin, macrocrystal-monohydrate, (MACROBID) 100 MG capsule Take 1 capsule (100  mg total) by mouth 2 (two) times daily. 10 capsule 0   polyvinyl alcohol (LIQUIFILM TEARS) 1.4 % ophthalmic solution Place 1 drop into both eyes as needed for dry eyes (allergies).     vitamin E (VITAMIN E) 200 UNIT capsule Take 200 Units by mouth daily.     No facility-administered medications prior to visit.     Allergies  Allergen Reactions   Penicillins Anaphylaxis, Shortness Of Breath and Swelling    Has patient had a PCN reaction causing immediate rash, facial/tongue/throat swelling, SOB or lightheadedness with hypotension: Yes Has patient had a PCN reaction causing severe rash involving mucus membranes or skin  necrosis: No Has patient had a PCN reaction that required hospitalization Yes Has patient had a PCN reaction occurring within the last 10 years: No If all of the above answers are "NO", then may proceed with Cephalosporin use.    Sulfamethoxazole Anaphylaxis    Pt state turns feet blue    Flagyl [Metronidazole] Swelling    Made feet blue and numb   Biktarvy [Bictegravir-Emtricitab-Tenofov] Rash    Bruise/ rash on her left thigh    Social History   Tobacco Use   Smoking status: Never   Smokeless tobacco: Never  Substance Use Topics   Alcohol use: Yes    Comment: socially   Drug use: No    Family History  Problem Relation Age of Onset   Heart disease Maternal Grandmother    Hypertension Maternal Grandmother    Hypertension Mother    Diabetes Mother    Hyperlipidemia Mother    Heart disease Mother    Stroke Mother    Diabetes Paternal Grandmother     Social History   Substance and Sexual Activity  Sexual Activity Yes     Objective:  There were no vitals filed for this visit. There is no height or weight on file to calculate BMI.  Physical Exam  Lab Results Lab Results  Component Value Date   WBC 4.2 09/16/2019   HGB 10.5 (L) 09/16/2019   HCT 32.6 (L) 09/16/2019   MCV 90.1 09/16/2019   PLT 210 09/16/2019    Lab Results  Component Value Date   CREATININE 0.72 09/16/2019   BUN 13 09/16/2019   NA 136 09/16/2019   K 3.3 (L) 09/16/2019   CL 106 09/16/2019   CO2 25 09/16/2019    Lab Results  Component Value Date   ALT 13 09/16/2019   AST 15 09/16/2019   ALKPHOS 51 09/16/2019   BILITOT 0.9 09/16/2019    No results found for: "CHOL", "HDL", "LDLCALC", "LDLDIRECT", "TRIG", "CHOLHDL" No results found for: "HIV1RNAQUANT", "HIV1RNAVL", "CD4TABS"   Assessment & Plan:   Problem List Items Addressed This Visit   None   Stephanie Dixon, MSN, NP-C [facility] Pager: [phone] Office:  [phone]  04/29/22  1:40 PM   

## 2022-04-30 LAB — T-HELPER CELLS (CD4) COUNT (NOT AT ARMC)
CD4 % Helper T Cell: 4 % — ABNORMAL LOW (ref 33–65)
CD4 T Cell Abs: 55 /uL — ABNORMAL LOW (ref 400–1790)

## 2022-04-30 MED ORDER — DARUN-COBIC-EMTRICIT-TENOFAF 800-150-200-10 MG PO TABS: 1.0000 | ORAL_TABLET | Freq: Every evening | ORAL | 11 refills | Status: AC

## 2022-04-30 MED ORDER — DAPSONE 100 MG PO TABS: 100.0000 mg | ORAL_TABLET | Freq: Every day | ORAL | 5 refills | Status: AC

## 2022-04-30 NOTE — Assessment & Plan Note (Signed)
Encouraged her to continue working with her GYN team. She is also starting to experience some perimenopausal symptoms (hot flashes / sweating, irritability) as well as dysparaunia a/w deeper penetration. She will discuss further recommendations with them at her ability.

## 2022-04-30 NOTE — Assessment & Plan Note (Addendum)
Declined flu vaccine. Aged out of HPV. Will review records to see if she is interested in any preventative vaccines going forward (prevnar and menveo).  Not currently working with any PCP.  Pap smear up to date with recent colposcopy CIN1 in April 2023. Needs repeat pap with GYN in April 2024.

## 2022-04-30 NOTE — Assessment & Plan Note (Signed)
New patient here to transfer HIV care.  Was initially diagnosed in 1997/1999 but did not enter care for treatment until 2019 per her report and review of chart from initial [facility] visit. She confides she has not always done well with adherence to her medication and has only been getting symtuza in 3-4 days a week on a good moment. She is motivated to correct this after our discussion today I think. Counseled about r/f mutations and difficulty treating her health condition.   I discussed with Guadelupe Sabin treatment options/side effects, benefits of treatment and long-term outcomes. I discussed how HIV is transmitted and the process of untreated HIV including increased risk for opportunistic infections, cancer, dementia and renal failure. Patient was counseled on routine HIV care including medication adherence, blood monitoring, necessary vaccines and follow up visits. Counseled regarding safe sex practices including: condom use, partner disclosure, limiting partners.   Will continue Symtuza for HIV treatment.  Restart Dapsone as well for OI prophy given last CD4 ~110. Patient has insurance and co-pay assistance was provided today.   General introduction to our clinic and integrated services. No needs identified.  Dental referral placed today for [facility]. Information to schedule appointment completed today.   Will return to clinic in 3 months to review lab work and check in on adherence.

## 2022-05-08 LAB — COMPLETE METABOLIC PANEL WITH GFR
AG Ratio: 0.9 (calc) — ABNORMAL LOW (ref 1.0–2.5)
ALT: 25 U/L (ref 6–29)
AST: 32 U/L (ref 10–35)
Albumin: 4 g/dL (ref 3.6–5.1)
Alkaline phosphatase (APISO): 63 U/L (ref 37–153)
BUN: 10 mg/dL (ref 7–25)
CO2: 27 mmol/L (ref 20–32)
Calcium: 9.2 mg/dL (ref 8.6–10.4)
Chloride: 107 mmol/L (ref 98–110)
Creat: 0.79 mg/dL (ref 0.50–1.03)
Globulin: 4.5 g/dL (calc) — ABNORMAL HIGH (ref 1.9–3.7)
Glucose, Bld: 79 mg/dL (ref 65–99)
Potassium: 3.9 mmol/L (ref 3.5–5.3)
Sodium: 140 mmol/L (ref 135–146)
Total Bilirubin: 0.3 mg/dL (ref 0.2–1.2)
Total Protein: 8.5 g/dL — ABNORMAL HIGH (ref 6.1–8.1)
eGFR: 91 mL/min/{1.73_m2} (ref >=60)

## 2022-05-08 LAB — HIV-1 INTEGRASE GENOTYPE

## 2022-05-08 LAB — CBC
HCT: 34.2 % — ABNORMAL LOW (ref 35.0–45.0)
Hemoglobin: 11.7 g/dL (ref 11.7–15.5)
MCH: 28.7 pg (ref 27.0–33.0)
MCHC: 34.2 g/dL (ref 32.0–36.0)
MCV: 84 fL (ref 80.0–100.0)
MPV: 12.2 fL (ref 7.5–12.5)
Platelets: 137 10*3/uL — ABNORMAL LOW (ref 140–400)
RBC: 4.07 10*6/uL (ref 3.80–5.10)
RDW: 14.3 % (ref 11.0–15.0)
WBC: 3.1 10*3/uL — ABNORMAL LOW (ref 3.8–10.8)

## 2022-05-08 LAB — HIV RNA, RTPCR W/R GT (RTI, PI,INT)
HIV 1 RNA Quant: 117000 copies/mL — ABNORMAL HIGH
HIV-1 RNA Quant, Log: 5.07 Log copies/mL — ABNORMAL HIGH

## 2022-05-08 LAB — HIV-1 GENOTYPE: HIV-1 Genotype: DETECTED — AB

## 2022-05-12 ENCOUNTER — Encounter: Payer: Self-pay | Admitting: Infectious Diseases

## 2022-05-14 NOTE — Telephone Encounter (Signed)
Received call from [facility] stating she did not have dental application for patient. Will need to complete this before she can be scheduled. Attempted to call patient to see if she could stop by office and fill this out. Not able to reach her at this time. Left voicemail requesting call back. Leatrice Jewels, RMA

## 2022-07-20 ENCOUNTER — Encounter: Payer: Self-pay | Admitting: Emergency Medicine

## 2022-07-20 ENCOUNTER — Ambulatory Visit
Admission: EM | Admit: 2022-07-20 | Discharge: 2022-07-20 | Disposition: A | Discharge status: Home / Self Care | Payer: BC Managed Care – PPO | Attending: Internal Medicine | Admitting: Internal Medicine

## 2022-07-20 DIAGNOSIS — Z3202 Encounter for pregnancy test, result negative: Secondary | ICD-10-CM | POA: Diagnosis not present

## 2022-07-20 DIAGNOSIS — N898 Other specified noninflammatory disorders of vagina: Secondary | ICD-10-CM

## 2022-07-20 DIAGNOSIS — J029 Acute pharyngitis, unspecified: Secondary | ICD-10-CM

## 2022-07-20 DIAGNOSIS — R053 Chronic cough: Secondary | ICD-10-CM | POA: Diagnosis not present

## 2022-07-20 DIAGNOSIS — Z113 Encounter for screening for infections with a predominantly sexual mode of transmission: Secondary | ICD-10-CM

## 2022-07-20 DIAGNOSIS — J069 Acute upper respiratory infection, unspecified: Secondary | ICD-10-CM | POA: Insufficient documentation

## 2022-07-20 LAB — POCT URINE PREGNANCY: Preg Test, Ur: NEGATIVE

## 2022-07-20 LAB — POCT RAPID STREP A (OFFICE): Rapid Strep A Screen: NEGATIVE

## 2022-07-20 MED ORDER — AZITHROMYCIN 250 MG PO TABS: ORAL_TABLET | ORAL | 0 refills | Status: AC

## 2022-07-20 MED ORDER — BENZONATATE 100 MG PO CAPS: 100.0000 mg | ORAL_CAPSULE | Freq: Three times a day (TID) | ORAL | 0 refills | Status: DC | PRN

## 2022-07-20 NOTE — Discharge Instructions (Signed)
Rapid strep test was negative.  Throat culture is pending.  We will call if it is abnormal.  I have prescribed you an antibiotic and a cough medication to alleviate symptoms.  Please follow-up if symptoms persist or worsen.  Your vaginal swab is pending.  We will call if it is abnormal and treat as appropriate.  We ask that you refrain from sexual activity until test results and treatment are complete.  Pregnancy test was negative.

## 2022-07-20 NOTE — ED Provider Notes (Signed)
[facility]    CSN: [id number] Arrival date & time: 07/20/22  0840      History   Chief Complaint Chief Complaint  Patient presents with   Cough   Sore Throat   Otalgia   Vaginal Discharge    HPI Kristin Mason is a 51 y.o. female.   Patient presents with 2 different chief complaints today.  Patient reports 2-week history of cough, sore throat, ear pain, nasal congestion.  Patient reports that her family members tested positive for strep throat recently.  Has taken over-the-counter cold and flu medications with minimal improvement of symptoms.  Patient reports history of asthma but has not had to use her albuterol inhaler.  Denies chest pain, shortness of breath, nausea, vomiting, diarrhea, abdominal pain.  Patient also reports a clear to white vaginal discharge and vaginal itching that started approximately 4 to 5 days ago.  Patient reports that she had sexual intercourse with her usual sexual partner but the condom broke so she is concerned for STD.  Although, she denies any obvious exposure to STD or that her sexual partner is having any similar symptoms.  Last menstrual cycle was at the end of November.  She has been having monthly menstrual cycles.  Denies any associated urinary symptoms, abdominal pain, pelvic pain, back pain, abnormal vaginal bleeding, hematuria, fever.   Cough Sore Throat  Otalgia Vaginal Discharge   Past Medical History:  Diagnosis Date   Abnormal Pap smear 98   cryo therapy   Asthma    Eczema    HIV (human immunodeficiency virus infection) ([place])    STD (female)    UTI (urinary tract infection)     Patient Active Problem List   Diagnosis Date Noted   Fibroids, intramural 07/16/2021   Pap smear abnormality of cervix/human papillomavirus (HPV) positive 07/16/2021   Healthcare maintenance 05/07/2021   Hidradenitis suppurativa 12/08/2020   HIV (human immunodeficiency virus infection) ([place]) 09/15/2019   Onychomycosis  01/23/2019   Unspecified asthma(493.90) 05/01/2013   Allergic rhinitis 05/01/2013   Hypokalemia 04/03/2013    Past Surgical History:  Procedure Laterality Date   CESAREAN SECTION     THERAPEUTIC ABORTION      OB History     Gravida  4   Para  3   Term  3   Preterm  0   AB  1   Living  3      SAB  0   IAB  1   Ectopic  0   Multiple  0   Live Births               Home Medications    Prior to Admission medications   Medication Sig Start Date End Date Taking? Authorizing Provider  azithromycin (ZITHROMAX Z-PAK) 250 MG tablet Take 2 tablets (500 mg total) by mouth daily for 1 day, THEN 1 tablet (250 mg total) daily for 4 days. 07/20/22 07/25/22 Yes Rayaan Garguilo, Michele Rockers, FNP  benzonatate (TESSALON) 100 MG capsule Take 1 capsule (100 mg total) by mouth every 8 (eight) hours as needed for cough. 07/20/22  Yes [place] Anastacio, Hildred Alamin E, FNP  acetaminophen (TYLENOL) 500 MG tablet Take 1,000 mg by mouth every 6 (six) hours as needed for mild pain.    [provider]  albuterol (PROVENTIL HFA;VENTOLIN HFA) 108 (90 Base) MCG/ACT inhaler Inhale 1-2 puffs into the lungs every 6 (six) hours as needed for wheezing or shortness of breath. Patient taking differently: Inhale 1-2 puffs into the  lungs every 6 (six) hours as needed for wheezing or shortness of breath. Wheezing 08/26/17   Fransico Meadow, PA-C  bacitracin-polymyxin b (POLYSPORIN) ophthalmic ointment Place 1 application into the right eye 2 (two) times daily. apply to eye every 12 hours while awake Patient not taking: Reported on 04/29/2022 09/16/19   Harold Hedge, MD  dapsone 100 MG tablet Take 1 tablet (100 mg total) by mouth daily. 04/30/22   [place] Callas, NP  Darunavir-Cobicistat-Emtricitabine-Tenofovir Alafenamide [facility]) 800-150-200-10 MG TABS Take 1 tablet by mouth every evening. 04/30/22   [place] Callas, NP  fluconazole (DIFLUCAN) 150 MG tablet Take 1 tablet (150 mg total) by mouth daily. Take at first sign  of vaginal yeast Patient not taking: Reported on 04/29/2022 03/31/22   Teodora Medici, FNP  nitrofurantoin, macrocrystal-monohydrate, (MACROBID) 100 MG capsule Take 1 capsule (100 mg total) by mouth 2 (two) times daily. 03/31/22   Teodora Medici, FNP  polyvinyl alcohol (LIQUIFILM TEARS) 1.4 % ophthalmic solution Place 1 drop into both eyes as needed for dry eyes (allergies). Patient not taking: Reported on 04/29/2022    [provider]  vitamin E (VITAMIN E) 200 UNIT capsule Take 200 Units by mouth daily.    [provider]    Family History Family History  Problem Relation Age of Onset   Heart disease Maternal Grandmother    Hypertension Maternal Grandmother    Hypertension Mother    Diabetes Mother    Hyperlipidemia Mother    Heart disease Mother    Stroke Mother    Diabetes Paternal Grandmother     Social History Social History   Tobacco Use   Smoking status: Never   Smokeless tobacco: Never  Substance Use Topics   Alcohol use: Yes    Comment: socially   Drug use: No     Allergies   Penicillins, Sulfamethoxazole, Flagyl [metronidazole], and Biktarvy [bictegravir-emtricitab-tenofov]   Review of Systems Review of Systems Per HPI  Physical Exam Triage Vital Signs ED Triage Vitals  Enc Vitals Group     BP 07/20/22 0949 132/82     Pulse Rate 07/20/22 0949 84     Resp 07/20/22 0949 18     Temp 07/20/22 0949 98.2 F (36.8 C)     Temp src --      SpO2 07/20/22 0949 94 %     Weight --      Height --      Head Circumference --      Peak Flow --      Pain Score 07/20/22 0948 6     Pain Loc --      Pain Edu? --      Excl. in [place]? --    No data found.  Updated Vital Signs BP 132/82   Pulse 84   Temp 98.2 F (36.8 C)   Resp 18   LMP 07/08/2022   SpO2 94%   Visual Acuity Right Eye Distance:   Left Eye Distance:   Bilateral Distance:    Right Eye Near:   Left Eye Near:    Bilateral Near:     Physical Exam Constitutional:       General: She is not in acute distress.    Appearance: Normal appearance. She is not toxic-appearing or diaphoretic.  HENT:     Head: Normocephalic and atraumatic.     Right Ear: Tympanic membrane and ear canal normal.     Left Ear: Tympanic membrane and ear canal normal.  Nose: Congestion present.     Mouth/Throat:     Mouth: Mucous membranes are moist.     Pharynx: Posterior oropharyngeal erythema present. No pharyngeal swelling, oropharyngeal exudate or uvula swelling.     Tonsils: No tonsillar exudate or tonsillar abscesses.  Eyes:     Extraocular Movements: Extraocular movements intact.     Conjunctiva/sclera: Conjunctivae normal.     Pupils: Pupils are equal, round, and reactive to light.  Cardiovascular:     Rate and Rhythm: Normal rate and regular rhythm.     Pulses: Normal pulses.     Heart sounds: Normal heart sounds.  Pulmonary:     Effort: Pulmonary effort is normal. No respiratory distress.     Breath sounds: Normal breath sounds. No stridor. No wheezing, rhonchi or rales.  Abdominal:     General: Abdomen is flat. Bowel sounds are normal.     Palpations: Abdomen is soft.  Genitourinary:    Comments: Deferred with shared decision making.  Self swab performed.  Musculoskeletal:        General: Normal range of motion.     Cervical back: Normal range of motion.  Skin:    General: Skin is warm and dry.  Neurological:     General: No focal deficit present.     Mental Status: She is alert and oriented to person, place, and time. Mental status is at baseline.  Psychiatric:        Mood and Affect: Mood normal.        Behavior: Behavior normal.      UC Treatments / Results  Labs (all labs ordered are listed, but only abnormal results are displayed) Labs Reviewed  CULTURE, GROUP A STREP [facility])  POCT URINE PREGNANCY  POCT RAPID STREP A (OFFICE)  CERVICOVAGINAL ANCILLARY ONLY    EKG   Radiology No results found.  Procedures Procedures (including critical  care time)  Medications Ordered in UC Medications - No data to display  Initial Impression / Assessment and Plan / UC Course  I have reviewed the triage vital signs and the nursing notes.  Pertinent labs & imaging results that were available during my care of the patient were reviewed by me and considered in my medical decision making (see chart for details).     Rapid strep test completed given patient exposure to strep throat even though there was a low suspicion.  It was negative.  Throat culture is pending.  Do not think viral testing is necessary given duration of symptoms as it would not change treatment.  Patient's symptoms most likely started off as a viral upper respiratory infection but given duration of symptoms there is concern for secondary bacterial infection, so will treat with azithromycin to cover for atypicals. This will also cover for strep throat given close exposure especially if rapid strep test was a false negative.  There are no adventitious lung sounds on exam or signs of respiratory compromise.  Oxygen is also normal so do not think that chest imaging is necessary.  Benzonatate prescribed to take as needed for cough as well.    Cervicovaginal swab pending for vaginal discharge.  UA was deferred given no urinary symptoms.  Urine pregnancy was negative.  Given no obvious confirmed exposure to STD, will await swab for further treatment.  Patient advised to refrain from sexual activity until test results and treatment are complete.  Patient was advised to follow-up if any symptoms persist or worsen.  Patient verbalized understanding and was agreeable with  plan. Final Clinical Impressions(s) / UC Diagnoses   Final diagnoses:  Acute upper respiratory infection  Persistent cough  Sore throat     Discharge Instructions      Rapid strep test was negative.  Throat culture is pending.  We will call if it is abnormal.  I have prescribed you an antibiotic and a cough  medication to alleviate symptoms.  Please follow-up if symptoms persist or worsen.  Your vaginal swab is pending.  We will call if it is abnormal and treat as appropriate.  We ask that you refrain from sexual activity until test results and treatment are complete.  Pregnancy test was negative.     ED Prescriptions     Medication Sig Dispense Auth. Provider   azithromycin (ZITHROMAX Z-PAK) 250 MG tablet Take 2 tablets (500 mg total) by mouth daily for 1 day, THEN 1 tablet (250 mg total) daily for 4 days. 6 tablet [place], [place] E, [place]   benzonatate (TESSALON) 100 MG capsule Take 1 capsule (100 mg total) by mouth every 8 (eight) hours as needed for cough. 21 capsule [place], Michele Rockers, [place]      PDMP not reviewed this encounter.   Teodora Medici, [place] 07/20/22 1050

## 2022-07-20 NOTE — ED Triage Notes (Signed)
Pt is present today with cough, sore throat, anda ear pain x2 weeks ago.   Pt states that she is also having vaginal discharge and itching started last Thursday

## 2022-07-21 ENCOUNTER — Telehealth (HOSPITAL_COMMUNITY): Payer: Self-pay | Admitting: Emergency Medicine

## 2022-07-21 LAB — CERVICOVAGINAL ANCILLARY ONLY
Bacterial Vaginitis (gardnerella): POSITIVE — AB
Candida Glabrata: NEGATIVE
Candida Vaginitis: POSITIVE — AB
Chlamydia: NEGATIVE
Comment: NEGATIVE
Comment: NEGATIVE
Comment: NEGATIVE
Comment: NEGATIVE
Comment: NEGATIVE
Comment: NORMAL
Neisseria Gonorrhea: NEGATIVE
Trichomonas: NEGATIVE

## 2022-07-21 MED ORDER — FLUCONAZOLE 150 MG PO TABS: 150.0000 mg | ORAL_TABLET | Freq: Once | ORAL | 0 refills | Status: AC

## 2022-07-21 MED ORDER — CLINDAMYCIN HCL 150 MG PO CAPS: 300.0000 mg | ORAL_CAPSULE | Freq: Two times a day (BID) | ORAL | 0 refills | Status: DC

## 2022-07-22 ENCOUNTER — Telehealth (HOSPITAL_COMMUNITY): Payer: Self-pay | Admitting: Emergency Medicine

## 2022-07-22 MED ORDER — FLUCONAZOLE 150 MG PO TABS: 150.0000 mg | ORAL_TABLET | Freq: Once | ORAL | 0 refills | Status: AC

## 2022-07-22 MED ORDER — CLINDAMYCIN HCL 150 MG PO CAPS: 300.0000 mg | ORAL_CAPSULE | Freq: Two times a day (BID) | ORAL | 0 refills | Status: AC

## 2022-07-22 NOTE — Telephone Encounter (Signed)
Patient needed prescription sent to a different pharmacy

## 2022-07-23 LAB — CULTURE, GROUP A STREP (THRC)

## 2022-07-24 ENCOUNTER — Telehealth: Payer: Self-pay | Admitting: Internal Medicine

## 2022-07-24 MED ORDER — CLINDAMYCIN PHOSPHATE 2 % VA CREA: 1.0000 | TOPICAL_CREAM | Freq: Every day | VAGINAL | 0 refills | Status: AC

## 2022-07-24 NOTE — Telephone Encounter (Signed)
Patient tested positive for bacterial vaginosis and vaginal yeast with cervicovaginal swab that was completed on 11/23.  She was sent clindamycin orally per protocol by callback nurse given patient's allergy to Flagyl.  Although, patient is currently taking azithromycin due to upper respiratory infection.  Therefore, do not want patient taking these 2 antibiotics at the same time given potential adverse effects.  Called patient to discuss this with her.  Patient advised to discontinue clindamycin orally and will send clindamycin vaginal cream instead.  Diflucan has already been sent over for patient.  Patient verbalized understanding and was agreeable with plan.  All questions answered.

## 2022-08-05 ENCOUNTER — Other Ambulatory Visit (HOSPITAL_COMMUNITY): Payer: Self-pay

## 2022-08-11 ENCOUNTER — Other Ambulatory Visit (HOSPITAL_COMMUNITY): Payer: Self-pay

## 2022-08-27 ENCOUNTER — Ambulatory Visit: Payer: BC Managed Care – PPO | Admitting: Infectious Diseases

## 2022-08-28 ENCOUNTER — Other Ambulatory Visit (HOSPITAL_COMMUNITY): Payer: Self-pay

## 2022-10-14 ENCOUNTER — Telehealth: Payer: Self-pay

## 2022-10-14 NOTE — Telephone Encounter (Signed)
Called [facility] to offer appointment, she is now back in care with [place], RN

## 2022-11-05 ENCOUNTER — Other Ambulatory Visit (HOSPITAL_COMMUNITY): Payer: Self-pay

## 2022-11-06 ENCOUNTER — Other Ambulatory Visit (HOSPITAL_COMMUNITY): Payer: Self-pay

## 2022-11-20 ENCOUNTER — Other Ambulatory Visit (HOSPITAL_COMMUNITY): Payer: Self-pay

## 2022-12-17 ENCOUNTER — Ambulatory Visit
Admission: EM | Admit: 2022-12-17 | Discharge: 2022-12-17 | Disposition: A | Discharge status: Home / Self Care | Payer: Medicaid Other | Attending: Nurse Practitioner | Admitting: Nurse Practitioner

## 2022-12-17 DIAGNOSIS — J45901 Unspecified asthma with (acute) exacerbation: Secondary | ICD-10-CM

## 2022-12-17 DIAGNOSIS — R062 Wheezing: Secondary | ICD-10-CM

## 2022-12-17 DIAGNOSIS — J208 Acute bronchitis due to other specified organisms: Secondary | ICD-10-CM

## 2022-12-17 DIAGNOSIS — B9689 Other specified bacterial agents as the cause of diseases classified elsewhere: Secondary | ICD-10-CM

## 2022-12-17 MED ORDER — AZITHROMYCIN 250 MG PO TABS: 250.0000 mg | ORAL_TABLET | Freq: Every day | ORAL | 0 refills | Status: DC

## 2022-12-17 MED ORDER — PREDNISONE 20 MG PO TABS: 40.0000 mg | ORAL_TABLET | Freq: Every day | ORAL | 0 refills | Status: AC

## 2022-12-17 MED ORDER — PROMETHAZINE-DM 6.25-15 MG/5ML PO SYRP: 5.0000 mL | ORAL_SOLUTION | Freq: Four times a day (QID) | ORAL | 0 refills | Status: AC | PRN

## 2022-12-17 MED ORDER — IPRATROPIUM-ALBUTEROL 0.5-2.5 (3) MG/3ML IN SOLN
3.0000 mL | Freq: Once | RESPIRATORY_TRACT | Status: AC
  Administered 2022-12-17: 3 mL via RESPIRATORY_TRACT

## 2022-12-17 NOTE — ED Provider Notes (Signed)
[facility]    CSN: [id number] Arrival date & time: 12/17/22  4098      History   Chief Complaint Chief Complaint  Patient presents with   Cough    HPI Kristin Mason is a 52 y.o. female presents for evaluation of cough for 2 weeks.  Patient does have a medical history of HIV.  Patient reports associated symptoms of productive cough with yellow/green phlegm, wheezing and shortness of breath. Denies N/V/D, sore throat, fevers, body aches. Patient does have a hx of asthma.  She has been using her albuterol inhaler multiple times a day with minimal improvement.  No known sick contacts.  Pt has taken Mucinex OTC for symptoms. Pt has no other concerns at this time.    Cough Associated symptoms: shortness of breath and wheezing     Past Medical History:  Diagnosis Date   Abnormal Pap smear 98   cryo therapy   Asthma    Eczema    HIV (human immunodeficiency virus infection) (HCC)    STD (female)    UTI (urinary tract infection)     Patient Active Problem List   Diagnosis Date Noted   Fibroids, intramural 07/16/2021   Pap smear abnormality of cervix/human papillomavirus (HPV) positive 07/16/2021   Healthcare maintenance 05/07/2021   Hidradenitis suppurativa 12/08/2020   HIV (human immunodeficiency virus infection) (HCC) 09/15/2019   Onychomycosis 01/23/2019   Unspecified asthma(493.90) 05/01/2013   Allergic rhinitis 05/01/2013   Hypokalemia 04/03/2013    Past Surgical History:  Procedure Laterality Date   CESAREAN SECTION     THERAPEUTIC ABORTION      OB History     Gravida  4   Para  3   Term  3   Preterm  0   AB  1   Living  3      SAB  0   IAB  1   Ectopic  0   Multiple  0   Live Births               Home Medications    Prior to Admission medications   Medication Sig Start Date End Date Taking? Authorizing Provider  azithromycin (ZITHROMAX) 250 MG tablet Take 1 tablet (250 mg total) by mouth daily. Take first 2  tablets together, then 1 every day until finished. 12/17/22  Yes Radford Pax, NP  predniSONE (DELTASONE) 20 MG tablet Take 2 tablets (40 mg total) by mouth daily with breakfast for 5 days. 12/17/22 12/22/22 Yes Radford Pax, NP  promethazine-dextromethorphan (PROMETHAZINE-DM) 6.25-15 MG/5ML syrup Take 5 mLs by mouth 4 (four) times daily as needed for cough. 12/17/22  Yes Radford Pax, NP  acetaminophen (TYLENOL) 500 MG tablet Take 1,000 mg by mouth every 6 (six) hours as needed for mild pain.    [provider]  albuterol (PROVENTIL HFA;VENTOLIN HFA) 108 (90 Base) MCG/ACT inhaler Inhale 1-2 puffs into the lungs every 6 (six) hours as needed for wheezing or shortness of breath. Patient taking differently: Inhale 1-2 puffs into the lungs every 6 (six) hours as needed for wheezing or shortness of breath. Wheezing 08/26/17   Elson Areas, PA-C  bacitracin-polymyxin b (POLYSPORIN) ophthalmic ointment Place 1 application into the right eye 2 (two) times daily. apply to eye every 12 hours while awake Patient not taking: Reported on 04/29/2022 09/16/19   Jae Dire, MD  benzonatate (TESSALON) 100 MG capsule Take 1 capsule (100 mg total) by mouth every 8 (eight) hours  as needed for cough. 07/20/22   Gustavus Bryant, FNP  dapsone 100 MG tablet Take 1 tablet (100 mg total) by mouth daily. 04/30/22   Blanchard Kelch, NP  Darunavir-Cobicistat-Emtricitabine-Tenofovir Alafenamide [facility]) 800-150-200-10 MG TABS Take 1 tablet by mouth every evening. 04/30/22   Blanchard Kelch, NP  polyvinyl alcohol (LIQUIFILM TEARS) 1.4 % ophthalmic solution Place 1 drop into both eyes as needed for dry eyes (allergies). Patient not taking: Reported on 04/29/2022    [provider]  vitamin E (VITAMIN E) 200 UNIT capsule Take 200 Units by mouth daily.    [provider]    Family History Family History  Problem Relation Age of Onset   Heart disease Maternal Grandmother    Hypertension Maternal  Grandmother    Hypertension Mother    Diabetes Mother    Hyperlipidemia Mother    Heart disease Mother    Stroke Mother    Diabetes Paternal Grandmother     Social History Social History   Tobacco Use   Smoking status: Never   Smokeless tobacco: Never  Substance Use Topics   Alcohol use: Yes    Comment: socially   Drug use: No     Allergies   Penicillins, Sulfamethoxazole, Flagyl [metronidazole], and Biktarvy [bictegravir-emtricitab-tenofov]   Review of Systems Review of Systems  HENT:  Positive for congestion.   Respiratory:  Positive for cough, shortness of breath and wheezing.      Physical Exam Triage Vital Signs ED Triage Vitals  Enc Vitals Group     BP 12/17/22 0840 120/77     Pulse Rate 12/17/22 0840 (!) 102     Resp 12/17/22 0840 (!) 24     Temp 12/17/22 0840 98.5 F (36.9 C)     Temp Source 12/17/22 0840 Oral     SpO2 12/17/22 0840 94 %     Weight --      Height --      Head Circumference --      Peak Flow --      Pain Score 12/17/22 0845 0     Pain Loc --      Pain Edu? --      Excl. in GC? --    No data found.  Updated Vital Signs BP 120/77 (BP Location: Left Arm)   Pulse 95 Comment: after duoneb treatment  Temp 98.5 F (36.9 C) (Oral)   Resp 20 Comment: after duoneb treatment  LMP 11/16/2022   SpO2 98% Comment: after duoneb treatment  Visual Acuity Right Eye Distance:   Left Eye Distance:   Bilateral Distance:    Right Eye Near:   Left Eye Near:    Bilateral Near:     Physical Exam Vitals and nursing note reviewed.  Constitutional:      General: She is not in acute distress.    Appearance: She is well-developed. She is not ill-appearing.  HENT:     Head: Normocephalic and atraumatic.     Right Ear: Tympanic membrane and ear canal normal.     Left Ear: Tympanic membrane and ear canal normal.     Nose: Congestion present.     Mouth/Throat:     Mouth: Mucous membranes are moist.     Pharynx: Oropharynx is clear. Uvula  midline. No oropharyngeal exudate or posterior oropharyngeal erythema.     Tonsils: No tonsillar exudate or tonsillar abscesses.  Eyes:     Conjunctiva/sclera: Conjunctivae normal.     Pupils: Pupils are equal, round,  and reactive to light.  Cardiovascular:     Rate and Rhythm: Normal rate and regular rhythm.     Heart sounds: Normal heart sounds.  Pulmonary:     Effort: Pulmonary effort is normal.     Breath sounds: Wheezing present.     Comments: Expiratory wheezing in all lung fields - improved after nebulizer  Musculoskeletal:     Cervical back: Normal range of motion and neck supple.  Lymphadenopathy:     Cervical: No cervical adenopathy.  Skin:    General: Skin is warm and dry.  Neurological:     General: No focal deficit present.     Mental Status: She is alert and oriented to person, place, and time.  Psychiatric:        Mood and Affect: Mood normal.        Behavior: Behavior normal.      UC Treatments / Results  Labs (all labs ordered are listed, but only abnormal results are displayed) Labs Reviewed - No data to display  EKG   Radiology No results found.  Procedures Procedures (including critical care time)  Medications Ordered in UC Medications  ipratropium-albuterol (DUONEB) 0.5-2.5 (3) MG/3ML nebulizer solution 3 mL (3 mLs Nebulization Given 12/17/22 0908)    Initial Impression / Assessment and Plan / UC Course  I have reviewed the triage vital signs and the nursing notes.  Pertinent labs & imaging results that were available during my care of the patient were reviewed by me and considered in my medical decision making (see chart for details).     Reviewed exam and symptoms with patient.  No red flags. Wheezing improved after nebulizer and patient reports improvement of symptoms Will start Zithromax for bronchitis Prednisone daily for 5 days Promethazine DM as needed for cough.  Side effect profile reviewed Patient to continue albuterol inhaler as  needed Rest and fluids Patient to follow-up with PCP if symptoms do not improve ER precautions reviewed and patient verbalized understanding Final Clinical Impressions(s) / UC Diagnoses   Final diagnoses:  Wheezing  Moderate asthma with acute exacerbation, unspecified whether persistent  Acute bacterial bronchitis     Discharge Instructions      Start Zithromax as prescribed Prednisone daily for 5 days Promethazine DM as needed for cough.  Please note this medication can make you drowsy.  Do not drink alcohol or drive while on this medication Continue your albuterol inhaler as needed Rest and fluids Please follow-up with your PCP if your symptoms or not improving Please go to the ER if you develop any worsening symptoms Hope you feel better soon!     ED Prescriptions     Medication Sig Dispense Auth. Provider   predniSONE (DELTASONE) 20 MG tablet Take 2 tablets (40 mg total) by mouth daily with breakfast for 5 days. 10 tablet Radford Pax, NP   azithromycin (ZITHROMAX) 250 MG tablet Take 1 tablet (250 mg total) by mouth daily. Take first 2 tablets together, then 1 every day until finished. 6 tablet Radford Pax, NP   promethazine-dextromethorphan (PROMETHAZINE-DM) 6.25-15 MG/5ML syrup Take 5 mLs by mouth 4 (four) times daily as needed for cough. 118 mL Radford Pax, NP      PDMP not reviewed this encounter.   Radford Pax, NP 12/17/22 [phone]

## 2022-12-17 NOTE — Discharge Instructions (Signed)
Start Zithromax as prescribed Prednisone daily for 5 days Promethazine DM as needed for cough.  Please note this medication can make you drowsy.  Do not drink alcohol or drive while on this medication Continue your albuterol inhaler as needed Rest and fluids Please follow-up with your PCP if your symptoms or not improving Please go to the ER if you develop any worsening symptoms Hope you feel better soon!

## 2022-12-17 NOTE — ED Triage Notes (Signed)
Pt presents to UC w/ c/o cough x2 weeks. Pt states it has gotten worse the past 2 days and has become a productive cough with yellowish green phlegm. Pt has tried mucinex day and nighttime without relief. Pt is slightly short of breath while speaking and has audible expiratory wheezes.  Hx asthma and seasonal allergies.  While in triage, pt took 2 puffs of her own albuterol inhaler.

## 2022-12-17 NOTE — ED Notes (Signed)
Pt received duoneb and states she "feels better" and her "chest doesn't feel as tight." Vitals WNL and documented.

## 2023-02-27 ENCOUNTER — Ambulatory Visit
Admission: EM | Admit: 2023-02-27 | Discharge: 2023-02-27 | Disposition: A | Discharge status: Home / Self Care | Payer: Medicaid Other | Attending: Urgent Care | Admitting: Urgent Care

## 2023-02-27 DIAGNOSIS — J45901 Unspecified asthma with (acute) exacerbation: Secondary | ICD-10-CM

## 2023-02-27 MED ORDER — ALBUTEROL SULFATE (2.5 MG/3ML) 0.083% IN NEBU
2.5000 mg | INHALATION_SOLUTION | Freq: Once | RESPIRATORY_TRACT | Status: AC
  Administered 2023-02-27: 2.5 mg via RESPIRATORY_TRACT

## 2023-02-27 MED ORDER — GUAIFENESIN ER 600 MG PO TB12: 600.0000 mg | ORAL_TABLET | Freq: Two times a day (BID) | ORAL | 0 refills | Status: AC | PRN

## 2023-02-27 MED ORDER — MONTELUKAST SODIUM 10 MG PO TABS: 10.0000 mg | ORAL_TABLET | Freq: Every day | ORAL | 0 refills | Status: AC

## 2023-02-27 MED ORDER — PREDNISONE 50 MG PO TABS: 50.0000 mg | ORAL_TABLET | Freq: Every day | ORAL | 0 refills | Status: DC

## 2023-02-27 MED ORDER — AZITHROMYCIN 250 MG PO TABS: 250.0000 mg | ORAL_TABLET | Freq: Every day | ORAL | 0 refills | Status: DC

## 2023-02-27 MED ORDER — ALBUTEROL SULFATE (2.5 MG/3ML) 0.083% IN NEBU: 2.5000 mg | INHALATION_SOLUTION | RESPIRATORY_TRACT | 2 refills | Status: AC | PRN

## 2023-02-27 NOTE — ED Triage Notes (Signed)
Pt reports cough, headache, drainage and shortness of breath x 2 weeks. Tylenol and Mucinex gives no relief.   Pt requested refill fro her nebulizer machine.

## 2023-02-27 NOTE — ED Provider Notes (Signed)
[facility]    CSN: [id number] Arrival date & time: 02/27/23  0844      History   Chief Complaint Chief Complaint  Patient presents with   Cough    HPI Kristin Mason is a 52 y.o. female.   Pleasant 52 year old female with a known history of asthma presents today with a 2-week history of a cough.  She was seen here roughly 9 to 10 weeks ago with symptoms of the same, states that her symptoms cleared up completely, but 2 weeks ago came back.  States she is short of breath and wheezy.  Minimal exertion causes her to feel tight in the chest.  She does have an albuterol handheld inhaler which she has been using around-the-clock with only mild improvement.  She has a nebulizer machine at home, but no solution to go in it.  She also reports significant sinus congestion and postnasal drainage, which she feels is worsening the cough.  2 weeks ago when it started it was a dry cough, but now it has become extremely productive.  No hemoptysis.  No fever.  She does feel some sinus pain.  Mild headache.  Has tried numerous over-the-counter multisymptom cold preparations without significant relief.   Cough   Past Medical History:  Diagnosis Date   Abnormal Pap smear 98   cryo therapy   Asthma    Eczema    HIV (human immunodeficiency virus infection) (HCC)    STD (female)    UTI (urinary tract infection)     Patient Active Problem List   Diagnosis Date Noted   Fibroids, intramural 07/16/2021   Pap smear abnormality of cervix/human papillomavirus (HPV) positive 07/16/2021   Healthcare maintenance 05/07/2021   Hidradenitis suppurativa 12/08/2020   HIV (human immunodeficiency virus infection) (HCC) 09/15/2019   Onychomycosis 01/23/2019   Unspecified asthma(493.90) 05/01/2013   Allergic rhinitis 05/01/2013   Hypokalemia 04/03/2013    Past Surgical History:  Procedure Laterality Date   CESAREAN SECTION     THERAPEUTIC ABORTION      OB History     Gravida  4    Para  3   Term  3   Preterm  0   AB  1   Living  3      SAB  0   IAB  1   Ectopic  0   Multiple  0   Live Births               Home Medications    Prior to Admission medications   Medication Sig Start Date End Date Taking? Authorizing Provider  albuterol (PROVENTIL) (2.5 MG/3ML) 0.083% nebulizer solution Take 3 mLs (2.5 mg total) by nebulization every 4 (four) hours as needed for wheezing or shortness of breath. 02/27/23  Yes Abrham Maslowski L, PA  azithromycin (ZITHROMAX) 250 MG tablet Take 1 tablet (250 mg total) by mouth daily. Take first 2 tablets together, then 1 every day until finished. 02/27/23  Yes Siah Kannan L, PA  guaiFENesin (MUCINEX) 600 MG 12 hr tablet Take 1 tablet (600 mg total) by mouth 2 (two) times daily as needed for cough or to loosen phlegm. 02/27/23  Yes Sahily Biddle L, PA  montelukast (SINGULAIR) 10 MG tablet Take 1 tablet (10 mg total) by mouth at bedtime. 02/27/23  Yes Felisa Zechman L, PA  predniSONE (DELTASONE) 50 MG tablet Take 1 tablet (50 mg total) by mouth daily with breakfast. 02/27/23  Yes Eriverto Byrnes L, PA  acetaminophen (TYLENOL)  500 MG tablet Take 1,000 mg by mouth every 6 (six) hours as needed for mild pain.    [provider]  albuterol (PROVENTIL HFA;VENTOLIN HFA) 108 (90 Base) MCG/ACT inhaler Inhale 1-2 puffs into the lungs every 6 (six) hours as needed for wheezing or shortness of breath. Patient taking differently: Inhale 1-2 puffs into the lungs every 6 (six) hours as needed for wheezing or shortness of breath. Wheezing 08/26/17   Elson Areas, PA-C  dapsone 100 MG tablet Take 1 tablet (100 mg total) by mouth daily. 04/30/22   Blanchard Kelch, NP  Darunavir-Cobicistat-Emtricitabine-Tenofovir Alafenamide [facility]) 800-150-200-10 MG TABS Take 1 tablet by mouth every evening. 04/30/22   Blanchard Kelch, NP  polyvinyl alcohol (LIQUIFILM TEARS) 1.4 % ophthalmic solution Place 1 drop into both eyes as needed for dry  eyes (allergies). Patient not taking: Reported on 04/29/2022    [provider]  promethazine-dextromethorphan (PROMETHAZINE-DM) 6.25-15 MG/5ML syrup Take 5 mLs by mouth 4 (four) times daily as needed for cough. 12/17/22   Radford Pax, NP  vitamin E (VITAMIN E) 200 UNIT capsule Take 200 Units by mouth daily.    [provider]    Family History Family History  Problem Relation Age of Onset   Heart disease Maternal Grandmother    Hypertension Maternal Grandmother    Hypertension Mother    Diabetes Mother    Hyperlipidemia Mother    Heart disease Mother    Stroke Mother    Diabetes Paternal Grandmother     Social History Social History   Tobacco Use   Smoking status: Never   Smokeless tobacco: Never  Substance Use Topics   Alcohol use: Yes    Comment: socially   Drug use: No     Allergies   Penicillins, Sulfamethoxazole, Flagyl [metronidazole], and Biktarvy [bictegravir-emtricitab-tenofov]   Review of Systems Review of Systems  Respiratory:  Positive for cough.   As per HPI   Physical Exam Triage Vital Signs ED Triage Vitals  Encounter Vitals Group     BP 02/27/23 0921 113/79     Systolic BP Percentile --      Diastolic BP Percentile --      Pulse Rate 02/27/23 0921 87     Resp 02/27/23 0921 18     Temp 02/27/23 0921 98.9 F (37.2 C)     Temp Source 02/27/23 0921 Oral     SpO2 02/27/23 0921 92 %     Weight --      Height --      Head Circumference --      Peak Flow --      Pain Score 02/27/23 0924 0     Pain Loc --      Pain Education --      Exclude from Growth Chart --    No data found.  Updated Vital Signs BP 113/79 (BP Location: Right Arm)   Pulse 87   Temp 98.9 F (37.2 C) (Oral)   Resp 18   LMP 02/27/2023 (Exact Date)   SpO2 92%   Visual Acuity Right Eye Distance:   Left Eye Distance:   Bilateral Distance:    Right Eye Near:   Left Eye Near:    Bilateral Near:     Physical Exam Vitals and nursing note reviewed.   HENT:     Nose: Congestion and rhinorrhea present.  Pulmonary:     Breath sounds: Wheezing and rhonchi present.     Comments: Decreased breath sounds  bilaterally posterior lung fields     UC Treatments / Results  Labs (all labs ordered are listed, but only abnormal results are displayed) Labs Reviewed - No data to display  EKG   Radiology No results found.  Procedures Procedures (including critical care time)  Medications Ordered in UC Medications  albuterol (PROVENTIL) (2.5 MG/3ML) 0.083% nebulizer solution 2.5 mg (2.5 mg Nebulization Given 02/27/23 0958)    Initial Impression / Assessment and Plan / UC Course  I have reviewed the triage vital signs and the nursing notes.  Pertinent labs & imaging results that were available during my care of the patient were reviewed by me and considered in my medical decision making (see chart for details).     *** Final Clinical Impressions(s) / UC Diagnoses   Final diagnoses:  Moderate acute exacerbation of asthma     Discharge Instructions      Please start taking 1 tablet of prednisone every morning with breakfast. Take 1 tablet of montelukast every evening before bed. Please stop the over-the-counter Mucinex DM, and switch to the plain guaifenesin called in today.  This will help thin your secretions, please cough and spit out.  Please use the albuterol nebulizer solution every 4 hours around-the-clock for the next 24 hours.  Once you start to feel more open, you may switch to every 6 hours.  Once symptoms improve completely, you may stop the nebulizer and switch back to your handheld inhaler.  I have called in azithromycin, please only start if you do not feel significant symptom improvement in the next 48 hours.     ED Prescriptions     Medication Sig Dispense Auth. Provider   predniSONE (DELTASONE) 50 MG tablet Take 1 tablet (50 mg total) by mouth daily with breakfast. 6 tablet Amair Shrout L, PA   albuterol  (PROVENTIL) (2.5 MG/3ML) 0.083% nebulizer solution Take 3 mLs (2.5 mg total) by nebulization every 4 (four) hours as needed for wheezing or shortness of breath. 75 mL Yazhini Mcaulay L, PA   montelukast (SINGULAIR) 10 MG tablet Take 1 tablet (10 mg total) by mouth at bedtime. 30 tablet Shaden Lacher L, PA   guaiFENesin (MUCINEX) 600 MG 12 hr tablet Take 1 tablet (600 mg total) by mouth 2 (two) times daily as needed for cough or to loosen phlegm. 20 tablet Janesa Dockery L, PA   azithromycin (ZITHROMAX) 250 MG tablet Take 1 tablet (250 mg total) by mouth daily. Take first 2 tablets together, then 1 every day until finished. 6 tablet Senaida Chilcote L, [place]      PDMP not reviewed this encounter.

## 2023-02-27 NOTE — Discharge Instructions (Signed)
Please start taking 1 tablet of prednisone every morning with breakfast. Take 1 tablet of montelukast every evening before bed. Please stop the over-the-counter Mucinex DM, and switch to the plain guaifenesin called in today.  This will help thin your secretions, please cough and spit out.  Please use the albuterol nebulizer solution every 4 hours around-the-clock for the next 24 hours.  Once you start to feel more open, you may switch to every 6 hours.  Once symptoms improve completely, you may stop the nebulizer and switch back to your handheld inhaler.  I have called in azithromycin, please only start if you do not feel significant symptom improvement in the next 48 hours.

## 2023-08-02 ENCOUNTER — Ambulatory Visit
Admission: EM | Admit: 2023-08-02 | Discharge: 2023-08-02 | Disposition: A | Discharge status: Home / Self Care | Payer: Medicaid Other | Attending: Family Medicine | Admitting: Family Medicine

## 2023-08-02 DIAGNOSIS — B370 Candidal stomatitis: Secondary | ICD-10-CM

## 2023-08-02 DIAGNOSIS — R051 Acute cough: Secondary | ICD-10-CM | POA: Diagnosis not present

## 2023-08-02 DIAGNOSIS — J4521 Mild intermittent asthma with (acute) exacerbation: Secondary | ICD-10-CM | POA: Diagnosis not present

## 2023-08-02 DIAGNOSIS — J209 Acute bronchitis, unspecified: Secondary | ICD-10-CM

## 2023-08-02 LAB — POC COVID19/FLU A&B COMBO
Covid Antigen, POC: NEGATIVE
Influenza A Antigen, POC: NEGATIVE
Influenza B Antigen, POC: NEGATIVE

## 2023-08-02 MED ORDER — AZITHROMYCIN 250 MG PO TABS: 250.0000 mg | ORAL_TABLET | Freq: Every day | ORAL | 0 refills | Status: AC

## 2023-08-02 MED ORDER — PREDNISONE 20 MG PO TABS: 40.0000 mg | ORAL_TABLET | Freq: Every day | ORAL | 0 refills | Status: AC

## 2023-08-02 MED ORDER — IPRATROPIUM-ALBUTEROL 0.5-2.5 (3) MG/3ML IN SOLN
3.0000 mL | Freq: Once | RESPIRATORY_TRACT | Status: AC
  Administered 2023-08-02: 3 mL via RESPIRATORY_TRACT

## 2023-08-02 MED ORDER — BENZONATATE 200 MG PO CAPS: 200.0000 mg | ORAL_CAPSULE | Freq: Three times a day (TID) | ORAL | 0 refills | Status: AC | PRN

## 2023-08-02 MED ORDER — NYSTATIN 100000 UNIT/ML MT SUSP: 500000.0000 [IU] | Freq: Four times a day (QID) | OROMUCOSAL | 0 refills | Status: AC

## 2023-08-02 NOTE — Discharge Instructions (Addendum)
You were seen today for upper respiratory symptoms.  Your flu/covid was negative today.  I am unable to do an xray in the office today. Your symptoms could be due to another virus, but given you immune status, I will cover for possible bacterial infection with another antibiotic.  I have also sent out a repeat dose of steroid and tessalon perles.  I have sent out nystatin for possible thrush.  Please continue you nebulizer and inhaler as well.  Please return if you are not improving or worsening in the next 24-48 hrs, or go to the ER for evaluation.

## 2023-08-02 NOTE — ED Provider Notes (Signed)
[facility]    CSN: [id number] Arrival date & time: 08/02/23  0901      History   Chief Complaint Chief Complaint  Patient presents with   Shortness of Breath   Wheezing    HPI Kristin Mason is a 52 y.o. female.    Shortness of Breath Associated symptoms: wheezing   Wheezing Associated symptoms: fatigue and shortness of breath     Patient is here for continued cough, wheezing and sob.  Seen by local UC on 11/26 for uri symptoms.  Placed on doxy, prednisone and tessalon that day.  Returned on 12/2, for continue/worsening symptoms.  Chest xray was normal, but switched to zpack, and given duoneb.  She did feel better overall, but then symptoms restarted several days ago.   She is now having body aches, chills, along with cough, wheezing.  She did take mucinex and motrin for headache.   Also using her breathing treatment/inhaler.  She last did the neb last night, and inhaler this morning.  She also feels she may have thrust.  Some tongue pain with white coating.   She does have HIV, compliant with medication, asthma.      Past Medical History:  Diagnosis Date   Abnormal Pap smear 98   cryo therapy   Asthma    Eczema    HIV (human immunodeficiency virus infection) (HCC)    STD (female)    UTI (urinary tract infection)     Patient Active Problem List   Diagnosis Date Noted   Fibroids, intramural 07/16/2021   Pap smear abnormality of cervix/human papillomavirus (HPV) positive 07/16/2021   Healthcare maintenance 05/07/2021   Hidradenitis suppurativa 12/08/2020   HIV (human immunodeficiency virus infection) (HCC) 09/15/2019   Onychomycosis 01/23/2019   Asthma 05/01/2013   Allergic rhinitis 05/01/2013   Hypokalemia 04/03/2013    Past Surgical History:  Procedure Laterality Date   CESAREAN SECTION     THERAPEUTIC ABORTION      OB History     Gravida  4   Para  3   Term  3   Preterm  0   AB  1   Living  3      SAB  0    IAB  1   Ectopic  0   Multiple  0   Live Births               Home Medications    Prior to Admission medications   Medication Sig Start Date End Date Taking? Authorizing Provider  acetaminophen (TYLENOL) 500 MG tablet Take 1,000 mg by mouth every 6 (six) hours as needed for mild pain.    [provider]  albuterol (PROVENTIL HFA;VENTOLIN HFA) 108 (90 Base) MCG/ACT inhaler Inhale 1-2 puffs into the lungs every 6 (six) hours as needed for wheezing or shortness of breath. Patient taking differently: Inhale 1-2 puffs into the lungs every 6 (six) hours as needed for wheezing or shortness of breath. Wheezing 08/26/17   Elson Areas, PA-C  albuterol (PROVENTIL) (2.5 MG/3ML) 0.083% nebulizer solution Take 3 mLs (2.5 mg total) by nebulization every 4 (four) hours as needed for wheezing or shortness of breath. 02/27/23   Crain, Whitney L, PA  azithromycin (ZITHROMAX) 250 MG tablet Take 1 tablet (250 mg total) by mouth daily. Take first 2 tablets together, then 1 every day until finished. 02/27/23   Crain, Whitney L, PA  dapsone 100 MG tablet Take 1 tablet (100 mg total) by mouth daily.  04/30/22   Blanchard Kelch, NP  Darunavir-Cobicistat-Emtricitabine-Tenofovir Alafenamide [facility]) 800-150-200-10 MG TABS Take 1 tablet by mouth every evening. 04/30/22   Blanchard Kelch, NP  guaiFENesin (MUCINEX) 600 MG 12 hr tablet Take 1 tablet (600 mg total) by mouth 2 (two) times daily as needed for cough or to loosen phlegm. 02/27/23   Crain, Whitney L, PA  montelukast (SINGULAIR) 10 MG tablet Take 1 tablet (10 mg total) by mouth at bedtime. 02/27/23   Crain, Whitney L, PA  polyvinyl alcohol (LIQUIFILM TEARS) 1.4 % ophthalmic solution Place 1 drop into both eyes as needed for dry eyes (allergies). Patient not taking: Reported on 04/29/2022    [provider]  predniSONE (DELTASONE) 50 MG tablet Take 1 tablet (50 mg total) by mouth daily with breakfast. 02/27/23   Crain, Whitney L, PA   promethazine-dextromethorphan (PROMETHAZINE-DM) 6.25-15 MG/5ML syrup Take 5 mLs by mouth 4 (four) times daily as needed for cough. 12/17/22   Radford Pax, NP  vitamin E (VITAMIN E) 200 UNIT capsule Take 200 Units by mouth daily.    [provider]    Family History Family History  Problem Relation Age of Onset   Heart disease Maternal Grandmother    Hypertension Maternal Grandmother    Hypertension Mother    Diabetes Mother    Hyperlipidemia Mother    Heart disease Mother    Stroke Mother    Diabetes Paternal Grandmother     Social History Social History   Tobacco Use   Smoking status: Never   Smokeless tobacco: Never  Substance Use Topics   Alcohol use: Yes    Comment: socially   Drug use: No     Allergies   Penicillins, Sulfamethoxazole, Flagyl [metronidazole], and Biktarvy [bictegravir-emtricitab-tenofov]   Review of Systems Review of Systems  Constitutional:  Positive for chills and fatigue.  HENT: Negative.    Respiratory:  Positive for shortness of breath and wheezing.   Gastrointestinal: Negative.   Musculoskeletal: Negative.   Psychiatric/Behavioral: Negative.       Physical Exam Triage Vital Signs ED Triage Vitals  Encounter Vitals Group     BP 08/02/23 0912 124/75     Systolic BP Percentile --      Diastolic BP Percentile --      Pulse Rate 08/02/23 0912 (!) 115     Resp 08/02/23 0912 17     Temp 08/02/23 0912 99 F (37.2 C)     Temp Source 08/02/23 0912 Oral     SpO2 08/02/23 0912 95 %     Weight --      Height --      Head Circumference --      Peak Flow --      Pain Score 08/02/23 0911 6     Pain Loc --      Pain Education --      Exclude from Growth Chart --    No data found.  Updated Vital Signs BP 124/75 (BP Location: Right Arm)   Pulse (!) 115   Temp 99 F (37.2 C) (Oral)   Resp 17   LMP 08/02/2023 (Exact Date)   SpO2 95%   Visual Acuity Right Eye Distance:   Left Eye Distance:   Bilateral Distance:     Right Eye Near:   Left Eye Near:    Bilateral Near:     Physical Exam Constitutional:      Appearance: She is well-developed. She is not ill-appearing.  HENT:  Nose: Nose normal.     Mouth/Throat:     Mouth: Mucous membranes are moist.     Comments: Mild white coating on tongue Cardiovascular:     Rate and Rhythm: Normal rate and regular rhythm.  Pulmonary:     Effort: Pulmonary effort is normal.     Breath sounds: Examination of the right-upper field reveals wheezing. Examination of the left-upper field reveals wheezing. Wheezing present.  Musculoskeletal:        General: Normal range of motion.     Cervical back: Normal range of motion and neck supple.  Skin:    General: Skin is warm.  Neurological:     General: No focal deficit present.     Mental Status: She is alert.  Psychiatric:        Mood and Affect: Mood normal.     UC Treatments / Results  Labs (all labs ordered are listed, but only abnormal results are displayed) Labs Reviewed  POC COVID19/FLU A&B COMBO - Normal    EKG   Radiology No results found.  Procedures Procedures (including critical care time)  Medications Ordered in UC Medications  ipratropium-albuterol (DUONEB) 0.5-2.5 (3) MG/3ML nebulizer solution 3 mL (3 mLs Nebulization Given 08/02/23 0933)    Initial Impression / Assessment and Plan / UC Course  I have reviewed the triage vital signs and the nursing notes.  Pertinent labs & imaging results that were available during my care of the patient were reviewed by me and considered in my medical decision making (see chart for details).   Final Clinical Impressions(s) / UC Diagnoses   Final diagnoses:  Acute cough  Mild intermittent asthma with acute exacerbation  Oral thrush  Acute bronchitis, unspecified organism     Discharge Instructions      You were seen today for upper respiratory symptoms.  Your flu/covid was negative today.  I am unable to do an xray in the office  today. Your symptoms could be due to another virus, but given you immune status, I will cover for possible bacterial infection with another antibiotic.  I have also sent out a repeat dose of steroid and tessalon perles.  I have sent out nystatin for possible thrush.  Please continue you nebulizer and inhaler as well.  Please return if you are not improving or worsening in the next 24-48 hrs, or go to the ER for evaluation.     ED Prescriptions     Medication Sig Dispense Auth. Provider   nystatin (MYCOSTATIN) 100000 UNIT/ML suspension Take 5 mLs (500,000 Units total) by mouth 4 (four) times daily. Swish and spit four times/day 60 mL Yafet Cline, MD   benzonatate (TESSALON) 200 MG capsule Take 1 capsule (200 mg total) by mouth 3 (three) times daily as needed for cough. 21 capsule Draper Gallon, MD   predniSONE (DELTASONE) 20 MG tablet Take 2 tablets (40 mg total) by mouth daily for 5 days. 10 tablet Nathanyel Defenbaugh, MD   azithromycin (ZITHROMAX) 250 MG tablet Take 1 tablet (250 mg total) by mouth daily. Take first 2 tablets together, then 1 every day until finished. 6 tablet Jannifer Franklin, MD      PDMP not reviewed this encounter.   Jannifer Franklin, MD 08/02/23 1005

## 2023-08-02 NOTE — ED Triage Notes (Signed)
Pt presents with a productive cough X 1 mo.   Pt states it has been intermittent and has taken Tessalon, prednisone and abx.   Pt reports the cough not getting any better. Pt states she ran out of medicine that have helped her. Hx of asthma and bronchitis.   Home interventions: breathing tx and inhaler
